# Patient Record
Sex: Female | Born: 1950 | Race: White | Hispanic: No | Marital: Married | Smoking: Never smoker
Health system: Southern US, Community
[De-identification: ages and names within clinical notes are randomized; demographics above are authoritative.]

## PROBLEM LIST (undated history)

## (undated) DIAGNOSIS — M797 Fibromyalgia: Secondary | ICD-10-CM

## (undated) HISTORY — PX: RECTAL PROLAPSE REPAIR: SHX759

## (undated) HISTORY — PX: VAGINAL MUCOSE PROLAPSE RESECTION: SHX2641

## (undated) HISTORY — PX: REPLACEMENT TOTAL KNEE: SUR1224

---

## 1992-08-30 HISTORY — PX: PARTIAL HYSTERECTOMY: SHX80

## 2016-12-03 DIAGNOSIS — M797 Fibromyalgia: Secondary | ICD-10-CM | POA: Diagnosis not present

## 2016-12-28 DIAGNOSIS — E039 Hypothyroidism, unspecified: Secondary | ICD-10-CM | POA: Diagnosis not present

## 2016-12-28 DIAGNOSIS — G44209 Tension-type headache, unspecified, not intractable: Secondary | ICD-10-CM | POA: Diagnosis not present

## 2016-12-28 DIAGNOSIS — J45909 Unspecified asthma, uncomplicated: Secondary | ICD-10-CM | POA: Diagnosis not present

## 2016-12-28 DIAGNOSIS — M797 Fibromyalgia: Secondary | ICD-10-CM | POA: Diagnosis not present

## 2016-12-28 DIAGNOSIS — F1290 Cannabis use, unspecified, uncomplicated: Secondary | ICD-10-CM | POA: Diagnosis not present

## 2016-12-28 DIAGNOSIS — F329 Major depressive disorder, single episode, unspecified: Secondary | ICD-10-CM | POA: Diagnosis not present

## 2016-12-28 DIAGNOSIS — F4310 Post-traumatic stress disorder, unspecified: Secondary | ICD-10-CM | POA: Diagnosis not present

## 2016-12-28 DIAGNOSIS — M1990 Unspecified osteoarthritis, unspecified site: Secondary | ICD-10-CM | POA: Diagnosis not present

## 2016-12-28 DIAGNOSIS — G253 Myoclonus: Secondary | ICD-10-CM | POA: Diagnosis not present

## 2017-01-07 DIAGNOSIS — M797 Fibromyalgia: Secondary | ICD-10-CM | POA: Diagnosis not present

## 2017-01-07 DIAGNOSIS — M542 Cervicalgia: Secondary | ICD-10-CM | POA: Diagnosis not present

## 2017-01-07 DIAGNOSIS — M545 Low back pain: Secondary | ICD-10-CM | POA: Diagnosis not present

## 2017-01-07 DIAGNOSIS — M546 Pain in thoracic spine: Secondary | ICD-10-CM | POA: Diagnosis not present

## 2017-01-13 DIAGNOSIS — M797 Fibromyalgia: Secondary | ICD-10-CM | POA: Diagnosis not present

## 2017-01-13 DIAGNOSIS — M545 Low back pain: Secondary | ICD-10-CM | POA: Diagnosis not present

## 2017-01-13 DIAGNOSIS — M546 Pain in thoracic spine: Secondary | ICD-10-CM | POA: Diagnosis not present

## 2017-01-13 DIAGNOSIS — M542 Cervicalgia: Secondary | ICD-10-CM | POA: Diagnosis not present

## 2017-01-14 DIAGNOSIS — M546 Pain in thoracic spine: Secondary | ICD-10-CM | POA: Diagnosis not present

## 2017-01-14 DIAGNOSIS — M542 Cervicalgia: Secondary | ICD-10-CM | POA: Diagnosis not present

## 2017-01-14 DIAGNOSIS — M797 Fibromyalgia: Secondary | ICD-10-CM | POA: Diagnosis not present

## 2017-01-14 DIAGNOSIS — M545 Low back pain: Secondary | ICD-10-CM | POA: Diagnosis not present

## 2017-01-17 DIAGNOSIS — M546 Pain in thoracic spine: Secondary | ICD-10-CM | POA: Diagnosis not present

## 2017-01-17 DIAGNOSIS — M545 Low back pain: Secondary | ICD-10-CM | POA: Diagnosis not present

## 2017-01-17 DIAGNOSIS — M797 Fibromyalgia: Secondary | ICD-10-CM | POA: Diagnosis not present

## 2017-01-17 DIAGNOSIS — M542 Cervicalgia: Secondary | ICD-10-CM | POA: Diagnosis not present

## 2017-01-19 DIAGNOSIS — M545 Low back pain: Secondary | ICD-10-CM | POA: Diagnosis not present

## 2017-01-19 DIAGNOSIS — M542 Cervicalgia: Secondary | ICD-10-CM | POA: Diagnosis not present

## 2017-01-19 DIAGNOSIS — M797 Fibromyalgia: Secondary | ICD-10-CM | POA: Diagnosis not present

## 2017-01-19 DIAGNOSIS — M546 Pain in thoracic spine: Secondary | ICD-10-CM | POA: Diagnosis not present

## 2017-01-20 ENCOUNTER — Ambulatory Visit (INDEPENDENT_AMBULATORY_CARE_PROVIDER_SITE_OTHER): Payer: Medicare HMO | Admitting: Orthopedic Surgery

## 2017-01-20 ENCOUNTER — Ambulatory Visit (INDEPENDENT_AMBULATORY_CARE_PROVIDER_SITE_OTHER): Payer: Medicare HMO

## 2017-01-20 DIAGNOSIS — E039 Hypothyroidism, unspecified: Secondary | ICD-10-CM | POA: Diagnosis not present

## 2017-01-20 DIAGNOSIS — G8929 Other chronic pain: Secondary | ICD-10-CM

## 2017-01-20 DIAGNOSIS — M25511 Pain in right shoulder: Secondary | ICD-10-CM | POA: Diagnosis not present

## 2017-01-20 DIAGNOSIS — M1990 Unspecified osteoarthritis, unspecified site: Secondary | ICD-10-CM | POA: Diagnosis not present

## 2017-01-20 DIAGNOSIS — F4310 Post-traumatic stress disorder, unspecified: Secondary | ICD-10-CM | POA: Diagnosis not present

## 2017-01-20 DIAGNOSIS — M797 Fibromyalgia: Secondary | ICD-10-CM | POA: Diagnosis not present

## 2017-01-20 DIAGNOSIS — G44209 Tension-type headache, unspecified, not intractable: Secondary | ICD-10-CM | POA: Diagnosis not present

## 2017-01-20 DIAGNOSIS — F329 Major depressive disorder, single episode, unspecified: Secondary | ICD-10-CM | POA: Diagnosis not present

## 2017-01-20 DIAGNOSIS — G253 Myoclonus: Secondary | ICD-10-CM | POA: Diagnosis not present

## 2017-01-20 DIAGNOSIS — F1290 Cannabis use, unspecified, uncomplicated: Secondary | ICD-10-CM | POA: Diagnosis not present

## 2017-01-20 DIAGNOSIS — J45909 Unspecified asthma, uncomplicated: Secondary | ICD-10-CM | POA: Diagnosis not present

## 2017-01-21 DIAGNOSIS — M797 Fibromyalgia: Secondary | ICD-10-CM | POA: Diagnosis not present

## 2017-01-21 DIAGNOSIS — M542 Cervicalgia: Secondary | ICD-10-CM | POA: Diagnosis not present

## 2017-01-21 DIAGNOSIS — M545 Low back pain: Secondary | ICD-10-CM | POA: Diagnosis not present

## 2017-01-21 DIAGNOSIS — M546 Pain in thoracic spine: Secondary | ICD-10-CM | POA: Diagnosis not present

## 2017-01-22 ENCOUNTER — Encounter (INDEPENDENT_AMBULATORY_CARE_PROVIDER_SITE_OTHER): Payer: Self-pay | Admitting: Orthopedic Surgery

## 2017-01-22 DIAGNOSIS — M25511 Pain in right shoulder: Secondary | ICD-10-CM | POA: Diagnosis not present

## 2017-01-22 DIAGNOSIS — G8929 Other chronic pain: Secondary | ICD-10-CM

## 2017-01-22 MED ORDER — BUPIVACAINE HCL 0.5 % IJ SOLN
9.0000 mL | INTRAMUSCULAR | Status: AC | PRN
  Administered 2017-01-22: 9 mL via INTRA_ARTICULAR

## 2017-01-22 MED ORDER — METHYLPREDNISOLONE ACETATE 40 MG/ML IJ SUSP
40.0000 mg | INTRAMUSCULAR | Status: AC | PRN
  Administered 2017-01-22: 40 mg via INTRA_ARTICULAR

## 2017-01-22 MED ORDER — LIDOCAINE HCL 1 % IJ SOLN
5.0000 mL | INTRAMUSCULAR | Status: AC | PRN
  Administered 2017-01-22: 5 mL
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
## 2017-01-25 DIAGNOSIS — M797 Fibromyalgia: Secondary | ICD-10-CM | POA: Diagnosis not present

## 2017-01-25 DIAGNOSIS — F4310 Post-traumatic stress disorder, unspecified: Secondary | ICD-10-CM | POA: Diagnosis not present

## 2017-01-25 DIAGNOSIS — J45909 Unspecified asthma, uncomplicated: Secondary | ICD-10-CM | POA: Diagnosis not present

## 2017-01-25 DIAGNOSIS — E039 Hypothyroidism, unspecified: Secondary | ICD-10-CM | POA: Diagnosis not present

## 2017-01-25 DIAGNOSIS — E7800 Pure hypercholesterolemia, unspecified: Secondary | ICD-10-CM | POA: Diagnosis not present

## 2017-01-25 DIAGNOSIS — F329 Major depressive disorder, single episode, unspecified: Secondary | ICD-10-CM | POA: Diagnosis not present

## 2017-01-26 DIAGNOSIS — M546 Pain in thoracic spine: Secondary | ICD-10-CM | POA: Diagnosis not present

## 2017-01-26 DIAGNOSIS — M542 Cervicalgia: Secondary | ICD-10-CM | POA: Diagnosis not present

## 2017-01-26 DIAGNOSIS — M797 Fibromyalgia: Secondary | ICD-10-CM | POA: Diagnosis not present

## 2017-01-26 DIAGNOSIS — M545 Low back pain: Secondary | ICD-10-CM | POA: Diagnosis not present

## 2017-01-28 DIAGNOSIS — M797 Fibromyalgia: Secondary | ICD-10-CM | POA: Diagnosis not present

## 2017-01-28 DIAGNOSIS — M542 Cervicalgia: Secondary | ICD-10-CM | POA: Diagnosis not present

## 2017-01-28 DIAGNOSIS — M545 Low back pain: Secondary | ICD-10-CM | POA: Diagnosis not present

## 2017-01-28 DIAGNOSIS — M546 Pain in thoracic spine: Secondary | ICD-10-CM | POA: Diagnosis not present

## 2017-01-31 DIAGNOSIS — M797 Fibromyalgia: Secondary | ICD-10-CM | POA: Diagnosis not present

## 2017-01-31 DIAGNOSIS — M545 Low back pain: Secondary | ICD-10-CM | POA: Diagnosis not present

## 2017-01-31 DIAGNOSIS — M546 Pain in thoracic spine: Secondary | ICD-10-CM | POA: Diagnosis not present

## 2017-01-31 DIAGNOSIS — M542 Cervicalgia: Secondary | ICD-10-CM | POA: Diagnosis not present

## 2017-02-01 DIAGNOSIS — F4310 Post-traumatic stress disorder, unspecified: Secondary | ICD-10-CM | POA: Diagnosis not present

## 2017-02-08 DIAGNOSIS — M797 Fibromyalgia: Secondary | ICD-10-CM | POA: Diagnosis not present

## 2017-02-08 DIAGNOSIS — M546 Pain in thoracic spine: Secondary | ICD-10-CM | POA: Diagnosis not present

## 2017-02-08 DIAGNOSIS — M542 Cervicalgia: Secondary | ICD-10-CM | POA: Diagnosis not present

## 2017-02-08 DIAGNOSIS — M545 Low back pain: Secondary | ICD-10-CM | POA: Diagnosis not present

## 2017-02-10 DIAGNOSIS — M546 Pain in thoracic spine: Secondary | ICD-10-CM | POA: Diagnosis not present

## 2017-02-10 DIAGNOSIS — M545 Low back pain: Secondary | ICD-10-CM | POA: Diagnosis not present

## 2017-02-10 DIAGNOSIS — M542 Cervicalgia: Secondary | ICD-10-CM | POA: Diagnosis not present

## 2017-02-10 DIAGNOSIS — M797 Fibromyalgia: Secondary | ICD-10-CM | POA: Diagnosis not present

## 2017-02-15 DIAGNOSIS — M797 Fibromyalgia: Secondary | ICD-10-CM | POA: Diagnosis not present

## 2017-02-15 DIAGNOSIS — M545 Low back pain: Secondary | ICD-10-CM | POA: Diagnosis not present

## 2017-02-15 DIAGNOSIS — M542 Cervicalgia: Secondary | ICD-10-CM | POA: Diagnosis not present

## 2017-02-15 DIAGNOSIS — M546 Pain in thoracic spine: Secondary | ICD-10-CM | POA: Diagnosis not present

## 2017-02-17 DIAGNOSIS — M542 Cervicalgia: Secondary | ICD-10-CM | POA: Diagnosis not present

## 2017-02-17 DIAGNOSIS — M545 Low back pain: Secondary | ICD-10-CM | POA: Diagnosis not present

## 2017-02-17 DIAGNOSIS — M546 Pain in thoracic spine: Secondary | ICD-10-CM | POA: Diagnosis not present

## 2017-02-17 DIAGNOSIS — M797 Fibromyalgia: Secondary | ICD-10-CM | POA: Diagnosis not present

## 2017-02-22 DIAGNOSIS — M546 Pain in thoracic spine: Secondary | ICD-10-CM | POA: Diagnosis not present

## 2017-02-22 DIAGNOSIS — M542 Cervicalgia: Secondary | ICD-10-CM | POA: Diagnosis not present

## 2017-02-22 DIAGNOSIS — M797 Fibromyalgia: Secondary | ICD-10-CM | POA: Diagnosis not present

## 2017-02-22 DIAGNOSIS — M545 Low back pain: Secondary | ICD-10-CM | POA: Diagnosis not present

## 2017-02-24 ENCOUNTER — Ambulatory Visit
Admission: RE | Admit: 2017-02-24 | Discharge: 2017-02-24 | Disposition: A | Discharge status: Home / Self Care | Payer: Medicare HMO | Source: Ambulatory Visit | Attending: Orthopedic Surgery | Admitting: Orthopedic Surgery

## 2017-02-24 DIAGNOSIS — G8929 Other chronic pain: Secondary | ICD-10-CM

## 2017-02-24 DIAGNOSIS — M25511 Pain in right shoulder: Principal | ICD-10-CM

## 2017-02-24 DIAGNOSIS — M67813 Other specified disorders of tendon, right shoulder: Secondary | ICD-10-CM | POA: Diagnosis not present

## 2017-02-24 MED ORDER — IOPAMIDOL (ISOVUE-M 200) INJECTION 41%
12.0000 mL | Freq: Once | INTRAMUSCULAR | Status: AC
  Administered 2017-02-24: 12 mL via INTRA_ARTICULAR

## 2017-02-25 DIAGNOSIS — M545 Low back pain: Secondary | ICD-10-CM | POA: Diagnosis not present

## 2017-02-25 DIAGNOSIS — M542 Cervicalgia: Secondary | ICD-10-CM | POA: Diagnosis not present

## 2017-02-25 DIAGNOSIS — M546 Pain in thoracic spine: Secondary | ICD-10-CM | POA: Diagnosis not present

## 2017-02-25 DIAGNOSIS — M797 Fibromyalgia: Secondary | ICD-10-CM | POA: Diagnosis not present

## 2017-03-01 DIAGNOSIS — M546 Pain in thoracic spine: Secondary | ICD-10-CM | POA: Diagnosis not present

## 2017-03-01 DIAGNOSIS — M797 Fibromyalgia: Secondary | ICD-10-CM | POA: Diagnosis not present

## 2017-03-01 DIAGNOSIS — M545 Low back pain: Secondary | ICD-10-CM | POA: Diagnosis not present

## 2017-03-01 DIAGNOSIS — M542 Cervicalgia: Secondary | ICD-10-CM | POA: Diagnosis not present

## 2017-03-03 ENCOUNTER — Ambulatory Visit (INDEPENDENT_AMBULATORY_CARE_PROVIDER_SITE_OTHER): Payer: Medicare HMO | Admitting: Orthopedic Surgery

## 2017-03-03 ENCOUNTER — Encounter (INDEPENDENT_AMBULATORY_CARE_PROVIDER_SITE_OTHER): Payer: Self-pay | Admitting: Orthopedic Surgery

## 2017-03-03 DIAGNOSIS — M25511 Pain in right shoulder: Secondary | ICD-10-CM

## 2017-03-03 DIAGNOSIS — G8929 Other chronic pain: Secondary | ICD-10-CM | POA: Diagnosis not present

## 2017-03-03 DIAGNOSIS — M797 Fibromyalgia: Secondary | ICD-10-CM | POA: Diagnosis not present

## 2017-03-03 DIAGNOSIS — M542 Cervicalgia: Secondary | ICD-10-CM | POA: Diagnosis not present

## 2017-03-03 DIAGNOSIS — M545 Low back pain: Secondary | ICD-10-CM | POA: Diagnosis not present

## 2017-03-03 DIAGNOSIS — M546 Pain in thoracic spine: Secondary | ICD-10-CM | POA: Diagnosis not present

## 2017-03-03 NOTE — Progress Notes (Signed)
   Office Visit Note   Patient: Kayla MoronBarbara Harrington           Date of Birth: 1951-04-10           MRN: [id number] Visit Date: 03/03/2017 Requested by: Juluis RainierBarnes, Elizabeth, MD [address] [place], [place] [address] PCP: Juluis RainierBarnes, Elizabeth, MD  Subjective: Chief Complaint  Patient presents with  . Right Shoulder - Follow-up, Pain    HPI: Kayla MccreedyBarbara is a 66 year old patient with right shoulder pain.  She had a right shoulder injection 01/20/2017.  She's here for MRI scan review.  States the shoulder did well for 6 weeks after the injection but now the pain has started to recur.  She is in physical therapy as well for strengthening.  She takes ibuprofen for headaches.              ROS: All systems reviewed are negative as they relate to the chief complaint within the history of present illness.  Patient denies  fevers or chills.   Assessment & Plan: Visit Diagnoses:  1. Chronic right shoulder pain     Plan: Impression is right shoulder tendinitis with no definite rotator cuff tear or labral problem.  Plan is to continue with physical therapy.  Could consider another injection later that she or.  Does not have an operative indication the right shoulder.  No discrete acromioclavicular joint tenderness today.  I'll see her back as needed  Follow-Up Instructions: Return if symptoms worsen or fail to improve.   Orders:  No orders of the defined types were placed in this encounter.  No orders of the defined types were placed in this encounter.     Procedures: No procedures performed   Clinical Data: No additional findings.  Objective: Vital Signs: There were no vitals taken for this visit.  Physical Exam:   Constitutional: Patient appears well-developed HEENT:  Head: Normocephalic Eyes:EOM are normal Neck: Normal range of motion Cardiovascular: Normal rate Pulmonary/chest: Effort normal Neurologic: Patient is alert Skin: Skin is warm Psychiatric: Patient has normal mood and  affect    Ortho Exam: Orthopedic exam demonstrates good cervical spine range of motion full active and passive range of motion of the right shoulder with no acromioclavicular joint tenderness on direct palpation right or left side.  Rotator cuff strength is intact.  Passive range of motion is maintained.  No other masses lymph adenopathy or skin changes noted in the shoulder girdle region  Specialty Comments:  No specialty comments available.  Imaging: No results found.   PMFS History: There are no active problems to display for this patient.  No past medical history on file.  No family history on file.  No past surgical history on file. Social History   Occupational History  . Not on file.   Social History Main Topics  . Smoking status: Unknown If Ever Smoked  . Smokeless tobacco: Never Used  . Alcohol use Not on file  . Drug use: Unknown  . Sexual activity: Not on file

## 2017-03-09 DIAGNOSIS — M797 Fibromyalgia: Secondary | ICD-10-CM | POA: Diagnosis not present

## 2017-03-09 DIAGNOSIS — M545 Low back pain: Secondary | ICD-10-CM | POA: Diagnosis not present

## 2017-03-09 DIAGNOSIS — M546 Pain in thoracic spine: Secondary | ICD-10-CM | POA: Diagnosis not present

## 2017-03-09 DIAGNOSIS — M542 Cervicalgia: Secondary | ICD-10-CM | POA: Diagnosis not present

## 2017-03-11 DIAGNOSIS — M542 Cervicalgia: Secondary | ICD-10-CM | POA: Diagnosis not present

## 2017-03-11 DIAGNOSIS — M797 Fibromyalgia: Secondary | ICD-10-CM | POA: Diagnosis not present

## 2017-03-11 DIAGNOSIS — M546 Pain in thoracic spine: Secondary | ICD-10-CM | POA: Diagnosis not present

## 2017-03-11 DIAGNOSIS — M545 Low back pain: Secondary | ICD-10-CM | POA: Diagnosis not present

## 2017-03-15 ENCOUNTER — Encounter: Payer: Self-pay | Admitting: Neurology

## 2017-03-15 ENCOUNTER — Ambulatory Visit (INDEPENDENT_AMBULATORY_CARE_PROVIDER_SITE_OTHER): Payer: Medicare HMO | Admitting: Neurology

## 2017-03-15 ENCOUNTER — Other Ambulatory Visit: Payer: Self-pay | Admitting: Neurology

## 2017-03-15 VITALS — BP 104/70 | HR 88 | Resp 16 | Ht 70.0 in | Wt 197.0 lb

## 2017-03-15 DIAGNOSIS — F4310 Post-traumatic stress disorder, unspecified: Secondary | ICD-10-CM | POA: Diagnosis not present

## 2017-03-15 DIAGNOSIS — M797 Fibromyalgia: Secondary | ICD-10-CM | POA: Diagnosis not present

## 2017-03-15 DIAGNOSIS — G250 Essential tremor: Secondary | ICD-10-CM

## 2017-03-15 DIAGNOSIS — G253 Myoclonus: Secondary | ICD-10-CM

## 2017-03-15 DIAGNOSIS — M542 Cervicalgia: Secondary | ICD-10-CM | POA: Insufficient documentation

## 2017-03-15 DIAGNOSIS — G4700 Insomnia, unspecified: Secondary | ICD-10-CM | POA: Diagnosis not present

## 2017-03-15 DIAGNOSIS — F418 Other specified anxiety disorders: Secondary | ICD-10-CM

## 2017-03-15 MED ORDER — GABAPENTIN 800 MG PO TABS: 800.0000 mg | ORAL_TABLET | Freq: Three times a day (TID) | ORAL | 3 refills | Status: AC

## 2017-03-15 MED ORDER — TIZANIDINE HCL 4 MG PO TABS: ORAL_TABLET | ORAL | 3 refills | Status: DC

## 2017-03-15 MED ORDER — TIZANIDINE HCL 4 MG PO TABS: ORAL_TABLET | ORAL | 0 refills | Status: DC

## 2017-03-15 MED ORDER — PRIMIDONE 50 MG PO TABS: 100.0000 mg | ORAL_TABLET | Freq: Two times a day (BID) | ORAL | 1 refills | Status: DC
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
## 2017-03-17 DIAGNOSIS — M797 Fibromyalgia: Secondary | ICD-10-CM | POA: Diagnosis not present

## 2017-03-17 DIAGNOSIS — M545 Low back pain: Secondary | ICD-10-CM | POA: Diagnosis not present

## 2017-03-17 DIAGNOSIS — M546 Pain in thoracic spine: Secondary | ICD-10-CM | POA: Diagnosis not present

## 2017-03-17 DIAGNOSIS — M542 Cervicalgia: Secondary | ICD-10-CM | POA: Diagnosis not present

## 2017-03-22 DIAGNOSIS — M797 Fibromyalgia: Secondary | ICD-10-CM | POA: Diagnosis not present

## 2017-03-22 DIAGNOSIS — M545 Low back pain: Secondary | ICD-10-CM | POA: Diagnosis not present

## 2017-03-22 DIAGNOSIS — M546 Pain in thoracic spine: Secondary | ICD-10-CM | POA: Diagnosis not present

## 2017-03-22 DIAGNOSIS — M542 Cervicalgia: Secondary | ICD-10-CM | POA: Diagnosis not present

## 2017-03-29 DIAGNOSIS — M546 Pain in thoracic spine: Secondary | ICD-10-CM | POA: Diagnosis not present

## 2017-03-29 DIAGNOSIS — M797 Fibromyalgia: Secondary | ICD-10-CM | POA: Diagnosis not present

## 2017-03-29 DIAGNOSIS — M545 Low back pain: Secondary | ICD-10-CM | POA: Diagnosis not present

## 2017-03-29 DIAGNOSIS — M542 Cervicalgia: Secondary | ICD-10-CM | POA: Diagnosis not present

## 2017-03-31 ENCOUNTER — Other Ambulatory Visit: Payer: Self-pay

## 2017-03-31 DIAGNOSIS — M545 Low back pain: Secondary | ICD-10-CM | POA: Diagnosis not present

## 2017-03-31 DIAGNOSIS — M542 Cervicalgia: Secondary | ICD-10-CM | POA: Diagnosis not present

## 2017-03-31 DIAGNOSIS — M797 Fibromyalgia: Secondary | ICD-10-CM | POA: Diagnosis not present

## 2017-03-31 DIAGNOSIS — M546 Pain in thoracic spine: Secondary | ICD-10-CM | POA: Diagnosis not present

## 2017-04-05 DIAGNOSIS — M546 Pain in thoracic spine: Secondary | ICD-10-CM | POA: Diagnosis not present

## 2017-04-05 DIAGNOSIS — M542 Cervicalgia: Secondary | ICD-10-CM | POA: Diagnosis not present

## 2017-04-05 DIAGNOSIS — M545 Low back pain: Secondary | ICD-10-CM | POA: Diagnosis not present

## 2017-04-05 DIAGNOSIS — M797 Fibromyalgia: Secondary | ICD-10-CM | POA: Diagnosis not present

## 2017-04-11 ENCOUNTER — Ambulatory Visit
Admission: RE | Admit: 2017-04-11 | Discharge: 2017-04-11 | Disposition: A | Discharge status: Home / Self Care | Payer: Medicare HMO | Source: Ambulatory Visit | Attending: Neurology | Admitting: Neurology

## 2017-04-11 ENCOUNTER — Other Ambulatory Visit: Payer: Self-pay | Admitting: Neurology

## 2017-04-11 DIAGNOSIS — G253 Myoclonus: Secondary | ICD-10-CM

## 2017-04-11 DIAGNOSIS — M50223 Other cervical disc displacement at C6-C7 level: Secondary | ICD-10-CM | POA: Diagnosis not present

## 2017-04-11 DIAGNOSIS — M50222 Other cervical disc displacement at C5-C6 level: Secondary | ICD-10-CM | POA: Diagnosis not present

## 2017-04-11 DIAGNOSIS — M542 Cervicalgia: Secondary | ICD-10-CM

## 2017-04-14 DIAGNOSIS — M546 Pain in thoracic spine: Secondary | ICD-10-CM | POA: Diagnosis not present

## 2017-04-14 DIAGNOSIS — M542 Cervicalgia: Secondary | ICD-10-CM | POA: Diagnosis not present

## 2017-04-14 DIAGNOSIS — M545 Low back pain: Secondary | ICD-10-CM | POA: Diagnosis not present

## 2017-04-14 DIAGNOSIS — M797 Fibromyalgia: Secondary | ICD-10-CM | POA: Diagnosis not present

## 2017-04-15 ENCOUNTER — Ambulatory Visit: Payer: Medicare HMO | Admitting: Psychology

## 2017-04-18 ENCOUNTER — Telehealth: Payer: Self-pay | Admitting: *Deleted

## 2017-04-18 NOTE — Telephone Encounter (Signed)
-----   Message from Asa Lente, MD sent at 04/17/2017  8:13 PM EDT ----- Please let her know that she has disc protrusions and bone spurs to the right at C3-C4 and to the left at C6-C7. These could cause some shoulder or arm pain but would not cause muscle jerking (myoclonus)

## 2017-04-18 NOTE — Telephone Encounter (Signed)
I have spoken with Kayla Harrington this afternoon, and per RAS, reviewed MRI results as below.  She verbalized understanding of same/fim

## 2017-04-21 DIAGNOSIS — M542 Cervicalgia: Secondary | ICD-10-CM | POA: Diagnosis not present

## 2017-04-21 DIAGNOSIS — M546 Pain in thoracic spine: Secondary | ICD-10-CM | POA: Diagnosis not present

## 2017-04-21 DIAGNOSIS — M797 Fibromyalgia: Secondary | ICD-10-CM | POA: Diagnosis not present

## 2017-04-21 DIAGNOSIS — M545 Low back pain: Secondary | ICD-10-CM | POA: Diagnosis not present

## 2017-04-22 DIAGNOSIS — F4310 Post-traumatic stress disorder, unspecified: Secondary | ICD-10-CM | POA: Diagnosis not present

## 2017-04-25 ENCOUNTER — Telehealth: Payer: Self-pay | Admitting: Neurology

## 2017-04-25 MED ORDER — ONDANSETRON 8 MG PO TBDP: 8.0000 mg | ORAL_TABLET | Freq: Three times a day (TID) | ORAL | 5 refills | Status: AC | PRN

## 2017-04-25 NOTE — Addendum Note (Signed)
Addended by: Candis Schatz I on: 04/25/2017 02:54 PM   Modules accepted: Orders

## 2017-04-25 NOTE — Telephone Encounter (Signed)
I have spoken with Chelsa this afternoon. She sts. her former neurologist rx's Zofran for nausea associated with h/a's.  She does not have to take this on a daily basis.  Allergic to Compazine and Phenergan.  Rx. escribed to [facility] as requested/fim

## 2017-04-25 NOTE — Telephone Encounter (Signed)
Patient requesting a new Rx for Ondansetron 8mg  which she takes every 4 hours as needed for nausea called to [facility] at [facility].

## 2017-04-26 DIAGNOSIS — F4310 Post-traumatic stress disorder, unspecified: Secondary | ICD-10-CM | POA: Diagnosis not present

## 2017-05-25 DIAGNOSIS — M545 Low back pain: Secondary | ICD-10-CM | POA: Diagnosis not present

## 2017-05-25 DIAGNOSIS — M546 Pain in thoracic spine: Secondary | ICD-10-CM | POA: Diagnosis not present

## 2017-05-25 DIAGNOSIS — M797 Fibromyalgia: Secondary | ICD-10-CM | POA: Diagnosis not present

## 2017-05-25 DIAGNOSIS — M542 Cervicalgia: Secondary | ICD-10-CM | POA: Diagnosis not present

## 2017-06-02 ENCOUNTER — Encounter: Payer: Self-pay | Admitting: Physical Medicine & Rehabilitation

## 2017-06-06 ENCOUNTER — Emergency Department (HOSPITAL_BASED_OUTPATIENT_CLINIC_OR_DEPARTMENT_OTHER)
Admission: EM | Admit: 2017-06-06 | Discharge: 2017-06-07 | Disposition: A | Discharge status: Home / Self Care | Payer: Medicare HMO | Attending: Emergency Medicine | Admitting: Emergency Medicine

## 2017-06-06 ENCOUNTER — Emergency Department (HOSPITAL_BASED_OUTPATIENT_CLINIC_OR_DEPARTMENT_OTHER): Payer: Medicare HMO

## 2017-06-06 ENCOUNTER — Encounter (HOSPITAL_BASED_OUTPATIENT_CLINIC_OR_DEPARTMENT_OTHER): Payer: Self-pay | Admitting: Emergency Medicine

## 2017-06-06 DIAGNOSIS — Z7982 Long term (current) use of aspirin: Secondary | ICD-10-CM | POA: Insufficient documentation

## 2017-06-06 DIAGNOSIS — K8500 Idiopathic acute pancreatitis without necrosis or infection: Secondary | ICD-10-CM | POA: Insufficient documentation

## 2017-06-06 DIAGNOSIS — R1013 Epigastric pain: Secondary | ICD-10-CM | POA: Diagnosis not present

## 2017-06-06 DIAGNOSIS — R1011 Right upper quadrant pain: Secondary | ICD-10-CM | POA: Diagnosis not present

## 2017-06-06 DIAGNOSIS — Z79899 Other long term (current) drug therapy: Secondary | ICD-10-CM | POA: Insufficient documentation

## 2017-06-06 DIAGNOSIS — R51 Headache: Secondary | ICD-10-CM | POA: Insufficient documentation

## 2017-06-06 DIAGNOSIS — E7800 Pure hypercholesterolemia, unspecified: Secondary | ICD-10-CM | POA: Diagnosis not present

## 2017-06-06 DIAGNOSIS — R109 Unspecified abdominal pain: Secondary | ICD-10-CM | POA: Diagnosis present

## 2017-06-06 DIAGNOSIS — K5900 Constipation, unspecified: Secondary | ICD-10-CM | POA: Diagnosis not present

## 2017-06-06 DIAGNOSIS — I959 Hypotension, unspecified: Secondary | ICD-10-CM | POA: Diagnosis not present

## 2017-06-06 DIAGNOSIS — E860 Dehydration: Secondary | ICD-10-CM | POA: Diagnosis not present

## 2017-06-06 DIAGNOSIS — R519 Headache, unspecified: Secondary | ICD-10-CM

## 2017-06-06 DIAGNOSIS — G8929 Other chronic pain: Secondary | ICD-10-CM | POA: Diagnosis not present

## 2017-06-06 HISTORY — DX: Fibromyalgia: M79.7

## 2017-06-06 LAB — COMPREHENSIVE METABOLIC PANEL
ALBUMIN: 4.2 g/dL (ref 3.5–5.0)
ALK PHOS: 57 U/L (ref 38–126)
ALT: 16 U/L (ref 14–54)
AST: 23 U/L (ref 15–41)
Anion gap: 6 (ref 5–15)
BILIRUBIN TOTAL: 0.4 mg/dL (ref 0.3–1.2)
BUN: 17 mg/dL (ref 6–20)
CALCIUM: 9.7 mg/dL (ref 8.9–10.3)
CO2: 28 mmol/L (ref 22–32)
CREATININE: 0.78 mg/dL (ref 0.44–1.00)
Chloride: 99 mmol/L — ABNORMAL LOW (ref 101–111)
GFR calc Af Amer: >60 mL/min (ref >60)
GFR calc non Af Amer: >60 mL/min (ref >60)
GLUCOSE: 90 mg/dL (ref 65–99)
Potassium: 4.2 mmol/L (ref 3.5–5.1)
SODIUM: 133 mmol/L — AB (ref 135–145)
TOTAL PROTEIN: 6.7 g/dL (ref 6.5–8.1)

## 2017-06-06 LAB — CBC WITH DIFFERENTIAL/PLATELET
Basophils Absolute: 0 10*3/uL (ref 0.0–0.1)
Basophils Relative: 0 %
EOS ABS: 0.1 10*3/uL (ref 0.0–0.7)
EOS PCT: 2 %
HCT: 41.2 % (ref 36.0–46.0)
HEMOGLOBIN: 13.7 g/dL (ref 12.0–15.0)
LYMPHS ABS: 1.4 10*3/uL (ref 0.7–4.0)
Lymphocytes Relative: 30 %
MCH: 30.2 pg (ref 26.0–34.0)
MCHC: 33.3 g/dL (ref 30.0–36.0)
MCV: 90.9 fL (ref 78.0–100.0)
MONO ABS: 0.4 10*3/uL (ref 0.1–1.0)
MONOS PCT: 8 %
Neutro Abs: 2.8 10*3/uL (ref 1.7–7.7)
Neutrophils Relative %: 60 %
Platelets: 183 10*3/uL (ref 150–400)
RBC: 4.53 MIL/uL (ref 3.87–5.11)
RDW: 13.7 % (ref 11.5–15.5)
WBC: 4.6 10*3/uL (ref 4.0–10.5)

## 2017-06-06 LAB — LIPASE, BLOOD: Lipase: 159 U/L — ABNORMAL HIGH (ref 11–51)

## 2017-06-06 LAB — I-STAT CG4 LACTIC ACID, ED: LACTIC ACID, VENOUS: 0.85 mmol/L (ref 0.5–1.9)

## 2017-06-06 LAB — TROPONIN I: Troponin I: <0.03 ng/mL (ref <0.03)

## 2017-06-06 MED ORDER — DEXAMETHASONE SODIUM PHOSPHATE 10 MG/ML IJ SOLN
10.0000 mg | Freq: Once | INTRAMUSCULAR | Status: AC
  Administered 2017-06-06: 10 mg via INTRAVENOUS
  Filled 2017-06-06: qty 1

## 2017-06-06 MED ORDER — VALPROATE SODIUM 500 MG/5ML IV SOLN
1000.0000 mg | Freq: Once | INTRAVENOUS | Status: AC
  Administered 2017-06-06: 1000 mg via INTRAVENOUS
  Filled 2017-06-06: qty 10

## 2017-06-06 MED ORDER — TIZANIDINE HCL 4 MG PO TABS
8.0000 mg | ORAL_TABLET | Freq: Once | ORAL | Status: DC
  Filled 2017-06-06: qty 2

## 2017-06-06 MED ORDER — IPRATROPIUM-ALBUTEROL 0.5-2.5 (3) MG/3ML IN SOLN
RESPIRATORY_TRACT | Status: AC
  Filled 2017-06-06: qty 3

## 2017-06-06 MED ORDER — VALPROATE SODIUM 500 MG/5ML IV SOLN
INTRAVENOUS | Status: AC
  Filled 2017-06-06: qty 10

## 2017-06-06 MED ORDER — GABAPENTIN 300 MG PO CAPS
800.0000 mg | ORAL_CAPSULE | Freq: Once | ORAL | Status: AC
  Administered 2017-06-06: 800 mg via ORAL
  Filled 2017-06-06: qty 2

## 2017-06-06 MED ORDER — CLONAZEPAM 1 MG PO TABS
1.0000 mg | ORAL_TABLET | Freq: Once | ORAL | Status: DC
  Filled 2017-06-06: qty 1

## 2017-06-06 MED ORDER — DIAZEPAM 5 MG PO TABS
5.0000 mg | ORAL_TABLET | Freq: Once | ORAL | Status: AC
  Administered 2017-06-06: 5 mg via ORAL
  Filled 2017-06-06: qty 1

## 2017-06-06 MED ORDER — MAGNESIUM CITRATE PO SOLN
1.0000 | Freq: Once | ORAL | Status: AC
  Administered 2017-06-06: 1 via ORAL
  Filled 2017-06-06: qty 296

## 2017-06-06 MED ORDER — SODIUM CHLORIDE 0.9 % IV BOLUS (SEPSIS)
1000.0000 mL | Freq: Once | INTRAVENOUS | Status: AC
  Administered 2017-06-06: 1000 mL via INTRAVENOUS

## 2017-06-06 MED ORDER — DIPHENHYDRAMINE HCL 50 MG/ML IJ SOLN
25.0000 mg | Freq: Once | INTRAMUSCULAR | Status: AC
  Administered 2017-06-06: 25 mg via INTRAVENOUS
  Filled 2017-06-06: qty 1

## 2017-06-06 MED ORDER — MAGNESIUM SULFATE 2 GM/50ML IV SOLN
2.0000 g | Freq: Once | INTRAVENOUS | Status: AC
  Administered 2017-06-06: 2 g via INTRAVENOUS
  Filled 2017-06-06: qty 50

## 2017-06-06 MED ORDER — KETOROLAC TROMETHAMINE 30 MG/ML IJ SOLN
30.0000 mg | Freq: Once | INTRAMUSCULAR | Status: AC
  Administered 2017-06-06: 30 mg via INTRAVENOUS
  Filled 2017-06-06: qty 1

## 2017-06-06 MED ORDER — METOCLOPRAMIDE HCL 5 MG/ML IJ SOLN
10.0000 mg | INTRAMUSCULAR | Status: AC
  Administered 2017-06-06: 10 mg via INTRAVENOUS
  Filled 2017-06-06: qty 2

## 2017-06-06 NOTE — ED Provider Notes (Signed)
[facility] Provider Note   CSN: [id number] Arrival date & time: 06/06/17  1616     History   Chief Complaint Chief Complaint  Patient presents with  . Dehydration  . gallbladder attack/sent  by PCP    HPI Kayla Harrington is a 66 y.o. female.  66yo F w/ PMH including fibromyalgia, anxiety/depression, PTSD, frequent headaches who p/w abdominal pain and headache. This morning after she woke up she had a sudden onset of central/epigastric pain that radiated to her back, was 7-8/10 in intensity, and lasted about 1.5 hours until it resolved spontaneously. No chest pain or SOB. She has a h/o chronic hx of R shoulder pain related to tendonitis and recent fall. She has had 4 days of headache similar to previous headaches. She gets them often. She went to PCP for the abdominal pain and he sent her here for further evaluation of abdominal pain. No abd pain currently. No nausea, vomiting, or diarrhea. She reports 3 weeks of constipation. She took 2 dulcolax today without relief yet.    The history is provided by the patient.    Past Medical History:  Diagnosis Date  . Fibromyalgia     Patient Active Problem List   Diagnosis Date Noted  . 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 Neck pain 03/15/2017  . Fibromyalgia 03/15/2017  . Benign essential tremor 03/15/2017  . PTSD (post-traumatic stress disorder) 03/15/2017  . Depression with anxiety 03/15/2017  . Insomnia 03/15/2017    Past Surgical History:  Procedure Laterality Date  . PARTIAL HYSTERECTOMY  1994  . RECTAL PROLAPSE REPAIR    . REPLACEMENT TOTAL KNEE Left   . VAGINAL MUCOSE PROLAPSE RESECTION      OB History    No data available       Home Medications    Prior to Admission medications   Medication Sig Start Date End Date Taking? Authorizing Provider  acyclovir (ZOVIRAX) 400 MG tablet Take 400 mg by mouth 5 (five) times daily.   Yes [provider]  Albuterol (PROVENTIL IN) Inhale into the lungs.   Yes  [provider]  aspirin EC 81 MG tablet Take 81 mg by mouth daily.   Yes [provider]  cholecalciferol (VITAMIN D) 1000 units tablet Take 5,000 Units by mouth daily.   Yes [provider]  clonazePAM (KLONOPIN) 2 MG tablet TK 1 T PO BID PRA 01/07/17  Yes [provider]  gabapentin (NEURONTIN) 800 MG tablet Take 1 tablet (800 mg total) by mouth 3 (three) times daily. 03/15/17  Yes Sater, Pearletha Furl, MD  ibuprofen (ADVIL,MOTRIN) 800 MG tablet  01/04/17  Yes [provider]  ondansetron (ZOFRAN ODT) 8 MG disintegrating tablet Take 1 tablet (8 mg total) by mouth every 8 (eight) hours as needed for nausea or vomiting. 04/25/17  Yes Sater, Pearletha Furl, MD  primidone (MYSOLINE) 50 MG tablet Take 2 tablets (100 mg total) by mouth 2 (two) times daily. 03/15/17  Yes Sater, Pearletha Furl, MD  QUEtiapine (SEROQUEL) 100 MG tablet  01/04/17  Yes [provider]  tiZANidine (ZANAFLEX) 4 MG tablet TAKE 1 TABLET BY MOUTH EVERY MORNING, TAKE 1 TABLET EVERY EVENING AND 2 TABLETS BY MOUTH EVERY NIGHT AT BEDTIME 04/11/17  Yes Sater, Pearletha Furl, MD  venlafaxine XR (EFFEXOR-XR) 75 MG 24 hr capsule TK 3 CS PO D 11/09/16  Yes [provider]  levothyroxine (SYNTHROID, LEVOTHROID) 125 MCG tablet  01/03/17   [provider]  venlafaxine (EFFEXOR) 75 MG tablet  01/04/17   [provider]  VENTOLIN HFA 108 (90 Base) MCG/ACT inhaler USE 2 PUFFS EVERY 4 H PRN 12/08/16   [provider]    Family History Family History  Problem Relation Age of Onset  . COPD Mother   . Heart failure Mother   . Diabetes type II Mother   . Stroke Mother   . Skin cancer Father   . Hypertension Father   . Dementia Father   . Brain cancer Brother   . Diabetes type II Brother   . Heart failure Brother   . COPD Brother     Social History Social History  Substance Use Topics  . Smoking status: Unknown If Ever Smoked  . Smokeless tobacco: Never Used  . Alcohol use  Not on file     Allergies   Compazine [prochlorperazine edisylate]; Cymbalta [duloxetine hcl]; and Phenergan [promethazine hcl]   Review of Systems Review of Systems All other systems reviewed and are negative except that which was mentioned in HPI   Physical Exam Updated Vital Signs BP 137/62 (BP Location: Right Arm)   Pulse 79   Temp 98.2 F (36.8 C) (Oral)   Resp 18   Ht  (1.778 m)   Wt 89.4 kg (197 lb)   SpO2 97%   BMI 28.27 kg/m   Physical Exam  Constitutional: She is oriented to person, place, and time. She appears well-developed and well-nourished. No distress.  uncomfortable  HENT:  Head: Normocephalic and atraumatic.  Mouth/Throat: Oropharynx is clear and moist.  Moist mucous membranes  Eyes: Pupils are equal, round, and reactive to light. Conjunctivae and EOM are normal.  Neck: Neck supple.  Cardiovascular: Normal rate, regular rhythm and normal heart sounds.   No murmur heard. Pulmonary/Chest: Effort normal and breath sounds normal.  Abdominal: Soft. Bowel sounds are normal. She exhibits no distension. There is no tenderness.  Musculoskeletal: She exhibits no edema.  Neurological: She is alert and oriented to person, place, and time.  Fluent speech Moving all 4 extremities equally No facial asymmetry  Skin: Skin is warm and dry.  Psychiatric:  Anxious, distressed, depressed mood  Nursing note and vitals reviewed.    ED Treatments / Results  Labs (all labs ordered are listed, but only abnormal results are displayed) Labs Reviewed  COMPREHENSIVE METABOLIC PANEL - Abnormal; Notable for the following:       Result Value   Sodium 133 (*)    Chloride 99 (*)    All other components within normal limits  LIPASE, BLOOD - Abnormal; Notable for the following:    Lipase 159 (*)    All other components within normal limits  CBC WITH DIFFERENTIAL/PLATELET  TROPONIN I  I-STAT CG4 LACTIC ACID, ED    EKG  EKG Interpretation  Date/Time:  Monday  June 06 2017 17:16:41 EDT Ventricular Rate:  69 PR Interval:    QRS Duration: 88 QT Interval:  379 QTC Calculation: 406 R Axis:   61 Text Interpretation:  Sinus rhythm Low voltage, precordial leads EKG WITHIN NORMAL LIMITS Confirmed by Frederick Peers [phone]) on 06/06/2017 5:21:41 PM       Radiology US Abdomen Limited Ruq  Result Date: 06/06/2017 CLINICAL DATA:  Epigastric pain radiating to the right shoulder for 4 days EXAM: ULTRASOUND ABDOMEN LIMITED RIGHT UPPER QUADRANT COMPARISON:  None. FINDINGS: Gallbladder: No gallstones or wall thickening visualized. No sonographic Murphy sign noted by sonographer. Common bile duct: Diameter: 5.2 mm Liver: No focal lesion identified. Within normal limits  in parenchymal echogenicity. Portal vein is patent on color Doppler imaging with normal direction of blood flow towards the liver. IMPRESSION: Normal liver, gallbladder and bile ducts. Electronically Signed   By: Ellery Plunk M.D.   On: 06/06/2017 19:01    Procedures Procedures (including critical care time)  Medications Ordered in ED Medications  clonazePAM (KLONOPIN) tablet 1 mg (1 mg Oral Not Given 06/06/17 2229)  tiZANidine (ZANAFLEX) tablet 8 mg (8 mg Oral Not Given 06/06/17 2247)  valproate (DEPACON) 500 MG/5ML injection (not administered)  sodium chloride 0.9 % bolus 1,000 mL (0 mLs Intravenous Stopped 06/06/17 1943)  metoCLOPramide (REGLAN) injection 10 mg (10 mg Intravenous Given 06/06/17 1747)  diphenhydrAMINE (BENADRYL) injection 25 mg (25 mg Intravenous Given 06/06/17 1747)  magnesium citrate solution 1 Bottle (1 Bottle Oral Given 06/06/17 1747)  ketorolac (TORADOL) 30 MG/ML injection 30 mg (30 mg Intravenous Given 06/06/17 2028)  magnesium sulfate IVPB 2 g 50 mL (0 g Intravenous Stopped 06/06/17 2124)  diazepam (VALIUM) tablet 5 mg (5 mg Oral Given 06/06/17 2028)  gabapentin (NEURONTIN) capsule 800 mg (800 mg Oral Given 06/06/17 2223)  dexamethasone (DECADRON) injection 10 mg (10 mg  Intravenous Given 06/06/17 2223)  valproate (DEPACON) 1,000 mg in dextrose 5 % 50 mL IVPB (0 mg Intravenous Stopped 06/07/17 0019)     Initial Impression / Assessment and Plan / ED Course  I have reviewed the triage vital signs and the nursing notes.  Pertinent labs & imaging results that were available during my care of the patient were reviewed by me and considered in my medical decision making (see chart for details).     Pt w/ sudden onset of epigastric pain that resolved spontaneously, Not associated with eating and no associated chest pain or shortness of breath. She was nontoxic on exam with normal vital signs. She mentioned her headache but states that the reason she came in was for her abdominal pain, her headaches are very common when she usually treats them at home. She had no focal abdominal tenderness on my exam and she denied any right upper quadrant tenderness. Regarding her shoulder pain, she has chronic right shoulder pain and this is unchanged recently. I did obtain EKG and trop which were reassuring.  Labs notable only for mildly elevated lipase at 159. Obtained US to evaluate for gallstones. US shows normal gallbladder and ducts. PT continues to have no abdominal pain and is tolerating liquids. Explained she may have mild episode of pancreatitis but given resolution of pain and no vomiting, recommended supportive measures including liquid diet, then gradual introduction of low fat solids as tolerated. Instructed to f/u with PCP for this.  Regarding her headaches, she had initially requested  IV dilaudid and I explained we do not use narcotics for headaches. Gave the patient extensive migraine cocktail including reglan, benadryl, IVF, magnesium, toradol, valium for neck muscle spasm, decadron,and finally depakote. On multiple reassessments she stated no improvement and on final reassessment she stated she was worse and just needed a shot of morphine or dilaudid. I again explained  that neurologists recommend not using narcotics for headache pain. She requested to go home stating that she would f/u with her neurologist for ongoing headache management. She did not want any other medications in ED. I sent note to her neurologist at [facility]. Return precautions reviewed and pt ambulatory at time of discharge.  Final Clinical Impressions(s) / ED Diagnoses   Final diagnoses:  Epigastric pain  Idiopathic acute pancreatitis without infection or necrosis  Chronic intractable headache, unspecified headache type    New Prescriptions Discharge Medication List as of 06/07/2017 12:12 AM       Dinorah Masullo, Ambrose Finland, MD 06/07/17 1238

## 2017-06-06 NOTE — ED Triage Notes (Signed)
Sent from PCP for possible dehydration  and gallbladder attack , epigastric pain, right shoulder pain. Hx shoulder tendinitis. deneis nausea nor vomiting. Alert and oriented x 4.  Added constipation x 2 weeks. Appears lethargic

## 2017-06-06 NOTE — ED Notes (Signed)
States she needs something for a headache and the only thing that works for her is Dilaudid  IV

## 2017-06-06 NOTE — ED Notes (Signed)
She still has a headache.

## 2017-06-06 NOTE — ED Notes (Signed)
Tolerated 1/2 bottle of water with no nausea.

## 2017-06-06 NOTE — ED Notes (Signed)
Pt is drinking water.

## 2017-06-06 NOTE — ED Notes (Signed)
States this medicine is just a waste of another hour. I just need the narcotic medicine. I am not an abuser. It's all that works for me.

## 2017-06-07 NOTE — ED Notes (Signed)
Pt verbalizes understanding of d/c instructions and denies any further needs at this time. 

## 2017-06-09 ENCOUNTER — Ambulatory Visit (INDEPENDENT_AMBULATORY_CARE_PROVIDER_SITE_OTHER): Payer: Medicare HMO | Admitting: Neurology

## 2017-06-09 DIAGNOSIS — G253 Myoclonus: Secondary | ICD-10-CM

## 2017-06-09 DIAGNOSIS — R51 Headache: Secondary | ICD-10-CM

## 2017-06-09 DIAGNOSIS — M542 Cervicalgia: Secondary | ICD-10-CM

## 2017-06-09 DIAGNOSIS — G8929 Other chronic pain: Secondary | ICD-10-CM

## 2017-06-09 DIAGNOSIS — M797 Fibromyalgia: Secondary | ICD-10-CM

## 2017-06-09 DIAGNOSIS — G250 Essential tremor: Secondary | ICD-10-CM

## 2017-06-09 DIAGNOSIS — R519 Headache, unspecified: Secondary | ICD-10-CM

## 2017-06-09 DIAGNOSIS — G4700 Insomnia, unspecified: Secondary | ICD-10-CM | POA: Diagnosis not present

## 2017-06-10 DIAGNOSIS — Z8719 Personal history of other diseases of the digestive system: Secondary | ICD-10-CM | POA: Diagnosis not present

## 2017-06-10 DIAGNOSIS — G44209 Tension-type headache, unspecified, not intractable: Secondary | ICD-10-CM | POA: Diagnosis not present

## 2017-06-13 ENCOUNTER — Telehealth: Payer: Self-pay | Admitting: Neurology

## 2017-06-13 ENCOUNTER — Encounter: Payer: Self-pay | Admitting: Neurology

## 2017-06-13 DIAGNOSIS — R519 Headache, unspecified: Secondary | ICD-10-CM | POA: Insufficient documentation

## 2017-06-13 DIAGNOSIS — R51 Headache: Secondary | ICD-10-CM

## 2017-06-13 MED ORDER — IMIPRAMINE HCL 25 MG PO TABS: 25.0000 mg | ORAL_TABLET | Freq: Every day | ORAL | 3 refills | Status: DC

## 2017-06-13 NOTE — Telephone Encounter (Signed)
Spoke with Kayla Harrington.  She sts. h/a returned Friday. Per RAS, ok for Imipramine  qhs.  She is agreeable.  Rx. escribed to [facility] per her request/fim

## 2017-06-13 NOTE — Telephone Encounter (Signed)
Pt called she is still having intractable HA. Today is a little better if she does not get out of bed at all. She is using tens unit. She said steroids have helped in the past. Please call to discuss
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
## 2017-06-16 DIAGNOSIS — M545 Low back pain: Secondary | ICD-10-CM | POA: Diagnosis not present

## 2017-06-16 DIAGNOSIS — M542 Cervicalgia: Secondary | ICD-10-CM | POA: Diagnosis not present

## 2017-06-16 DIAGNOSIS — M797 Fibromyalgia: Secondary | ICD-10-CM | POA: Diagnosis not present

## 2017-06-16 DIAGNOSIS — M546 Pain in thoracic spine: Secondary | ICD-10-CM | POA: Diagnosis not present

## 2017-06-17 ENCOUNTER — Telehealth: Payer: Self-pay

## 2017-06-17 ENCOUNTER — Encounter: Payer: Self-pay | Admitting: *Deleted

## 2017-06-17 ENCOUNTER — Encounter: Payer: Medicare HMO | Attending: Physical Medicine & Rehabilitation | Admitting: Physical Medicine & Rehabilitation

## 2017-06-17 VITALS — BP 164/86 | HR 100

## 2017-06-17 DIAGNOSIS — G43909 Migraine, unspecified, not intractable, without status migrainosus: Secondary | ICD-10-CM | POA: Insufficient documentation

## 2017-06-17 DIAGNOSIS — G4459 Other complicated headache syndrome: Secondary | ICD-10-CM

## 2017-06-17 DIAGNOSIS — G253 Myoclonus: Secondary | ICD-10-CM | POA: Insufficient documentation

## 2017-06-17 DIAGNOSIS — Z79899 Other long term (current) drug therapy: Secondary | ICD-10-CM | POA: Insufficient documentation

## 2017-06-17 DIAGNOSIS — M797 Fibromyalgia: Secondary | ICD-10-CM | POA: Insufficient documentation

## 2017-06-17 DIAGNOSIS — M25512 Pain in left shoulder: Secondary | ICD-10-CM | POA: Diagnosis not present

## 2017-06-17 DIAGNOSIS — F4310 Post-traumatic stress disorder, unspecified: Secondary | ICD-10-CM | POA: Insufficient documentation

## 2017-06-17 DIAGNOSIS — F329 Major depressive disorder, single episode, unspecified: Secondary | ICD-10-CM | POA: Diagnosis not present

## 2017-06-17 NOTE — Progress Notes (Signed)
Subjective:    Patient ID: Kayla MoronBarbara Harrington, female    DOB: 1951/03/19, 66 y.o.   MRN: [id number]  HPI 66 y/o with pmh/psh fibromyalgia, headaches, PTSD, ADD, anxiety, depression, myoclonus, neuropathy, left shoulder pain presents with migraines/headaches.  Started ~50 years, got worse ~30 years ago.  Pt went to the ED with abdominal pain, notes reviewed. Rest, ice improves the pain.  Stress, muscle spasms exacerbates the headaches.  Throbbing.  Non-radiating.  Constant.  Tried Motrin and tylenol, with some benefit.  Marijuana helps. Denies associated visual disturbance.  Multiple falls due to balance.  Pain limits enjoyable activities.  She follows up with Neurology for headaches and Ortho for left shoulder. Husband states almost no activity. Pt retired Engineer, civil (consulting)nurse.   Number of headache days/month:  Baseline 29 Number of headache hours per day: Baseline 24  Associated nausea Severe pain intensity  Photophobia; Mild Phonophobia: No Unilateral: Bilateral Pulsating: At times Disability/ER visits: 1 this year  Pain Inventory Average Pain 7 Pain Right Now 6 My pain is intermittent and aching  In the last 24 hours, has pain interfered with the following? General activity 6 Relation with others 7 Enjoyment of life 8 What TIME of day is your pain at its worst? all Sleep (in general) Good  Pain is worse with: some activites Pain improves with: heat/ice, therapy/exercise, TENS and injections Relief from Meds: 4  Mobility walk without assistance walk with assistance use a cane ability to climb steps?  yes do you drive?  no  Function disabled: date disabled 2007  Neuro/Psych numbness tremor tingling trouble walking spasms dizziness depression anxiety  Prior Studies Any changes since last visit?  no  Physicians involved in your care Any changes since last visit?  no   Family History  Problem Relation Age of Onset  . COPD Mother   . Heart failure Mother   . Diabetes type  II Mother   . Stroke Mother   . Skin cancer Father   . Hypertension Father   . Dementia Father   . Brain cancer Brother   . Diabetes type II Brother   . Heart failure Brother   . COPD Brother    Social History   Social History  . Marital status: Married    Spouse name: N/A  . Number of children: N/A  . Years of education: N/A   Social History Main Topics  . Smoking status: Never Smoker  . Smokeless tobacco: Never Used  . Alcohol use No  . Drug use: Yes    Types: Marijuana     Comment: used medical marijuana last 2 months ago  . Sexual activity: Not Asked   Other Topics Concern  . None   Social History Narrative  . None   Past Surgical History:  Procedure Laterality Date  . PARTIAL HYSTERECTOMY  1994  . RECTAL PROLAPSE REPAIR    . REPLACEMENT TOTAL KNEE Left   . VAGINAL MUCOSE PROLAPSE RESECTION     Past Medical History:  Diagnosis Date  . Fibromyalgia    BP (!) 164/86   Pulse 100   SpO2 98%   Opioid Risk Score:   Fall Risk Score:  `1  Depression screen PHQ 2/9  No flowsheet data found.   Review of Systems  Constitutional: Positive for appetite change.  HENT: Negative.   Eyes: Negative.   Respiratory: Negative.   Cardiovascular: Negative.   Gastrointestinal: Positive for constipation and nausea.  Endocrine: Negative.   Genitourinary: Negative.   Musculoskeletal: Positive  for arthralgias and myalgias.  Skin: Negative.   Allergic/Immunologic: Negative.   Neurological: Positive for headaches. Negative for numbness.  Hematological: Negative.   Psychiatric/Behavioral: Negative.   All other systems reviewed and are negative.      Objective:   Physical Exam Gen: NAD. Vital signs reviewed HENT: Normocephalic, Atraumatic Eyes: EOMI. No discharge.  Cardio: RRR. No JVD. Pulm: B/l clear to auscultation.  Effort normal Abd: Soft, BS+ MSK:  Gait antalgic.   No TTP   No edema.  Neuro: CN II-XII grossly intact.    Sensation intact to light touch  in all UE dermatomes  Strength  4+/5 in all UE myotomes  SLR neg Skin: Warm and Dry. Intact    Assessment & Plan:  66 y/o with pmh/psh fibromyalgia, headaches, PTSD, ADD, anxiety, depression, myoclonus, neuropathy, left shoulder pain presents with migraines/headaches.   1. Headaches  Being followed by Neurology  Recently started in imipramine with improvement, but increase in anxiety  2. Fibromyalgia  Ordered PT by PCP  Lyrica in effective  Allergic to Cymbalta  Trigger points with short term benefit  Could consider Sevella if pain returns  Being managed

## 2017-06-20 NOTE — Telephone Encounter (Signed)
Pt said while waiting for imipramine to kick in she is still experiencing severe HA's. Pt is very tearful, says she can't live like this. Said she is miserable. Please call

## 2017-06-20 NOTE — Telephone Encounter (Signed)
I have spoken with Kayla Harrington and encouraged decreased stimuli, ice/heat, massage, other relaxation techniques.  She doesn't feel the tpi given in our office helped for more than a few days.  Sts. is having nausea assoc. with h/a, but she hasn't picked Zofran up from the pharmacy yet. Tizanidine doesn't help as much as Soma, but RAS is not able to give Soma due to mult. other centrally activing meds that pt. is on. Sts. the referral from her pcp to a pain mx. physician "fell through."  She will check with her pcp for another referral to pain mx.  Sts. marijuana helps more than anything else, and she may forgo the copays/costs of other meds and just by marijuana as it helps.  She was very calm,  oriented times 4, no audible distress noted during our conversation./fim

## 2017-06-29 ENCOUNTER — Telehealth: Payer: Self-pay | Admitting: *Deleted

## 2017-06-29 DIAGNOSIS — M797 Fibromyalgia: Secondary | ICD-10-CM | POA: Diagnosis not present

## 2017-06-29 DIAGNOSIS — M542 Cervicalgia: Secondary | ICD-10-CM | POA: Diagnosis not present

## 2017-06-29 DIAGNOSIS — M545 Low back pain: Secondary | ICD-10-CM | POA: Diagnosis not present

## 2017-06-29 DIAGNOSIS — M546 Pain in thoracic spine: Secondary | ICD-10-CM | POA: Diagnosis not present

## 2017-06-29 MED ORDER — IMIPRAMINE HCL 25 MG PO TABS: 25.0000 mg | ORAL_TABLET | Freq: Every day | ORAL | 1 refills | Status: DC

## 2017-06-29 NOTE — Telephone Encounter (Signed)
Imipramine rx. converted from 30 day to 90 day rx. per faxed request from [employer]/fim

## 2017-07-01 ENCOUNTER — Ambulatory Visit (INDEPENDENT_AMBULATORY_CARE_PROVIDER_SITE_OTHER): Payer: Medicare HMO | Admitting: Psychology

## 2017-07-01 DIAGNOSIS — F4310 Post-traumatic stress disorder, unspecified: Secondary | ICD-10-CM | POA: Diagnosis not present

## 2017-07-01 DIAGNOSIS — F330 Major depressive disorder, recurrent, mild: Secondary | ICD-10-CM | POA: Diagnosis not present

## 2017-07-01 DIAGNOSIS — M546 Pain in thoracic spine: Secondary | ICD-10-CM | POA: Diagnosis not present

## 2017-07-01 DIAGNOSIS — M542 Cervicalgia: Secondary | ICD-10-CM | POA: Diagnosis not present

## 2017-07-01 DIAGNOSIS — M797 Fibromyalgia: Secondary | ICD-10-CM | POA: Diagnosis not present

## 2017-07-01 DIAGNOSIS — M545 Low back pain: Secondary | ICD-10-CM | POA: Diagnosis not present

## 2017-07-04 DIAGNOSIS — M542 Cervicalgia: Secondary | ICD-10-CM | POA: Diagnosis not present

## 2017-07-04 DIAGNOSIS — M545 Low back pain: Secondary | ICD-10-CM | POA: Diagnosis not present

## 2017-07-04 DIAGNOSIS — M797 Fibromyalgia: Secondary | ICD-10-CM | POA: Diagnosis not present

## 2017-07-04 DIAGNOSIS — M546 Pain in thoracic spine: Secondary | ICD-10-CM | POA: Diagnosis not present

## 2017-07-06 DIAGNOSIS — M797 Fibromyalgia: Secondary | ICD-10-CM | POA: Diagnosis not present

## 2017-07-06 DIAGNOSIS — M546 Pain in thoracic spine: Secondary | ICD-10-CM | POA: Diagnosis not present

## 2017-07-06 DIAGNOSIS — M545 Low back pain: Secondary | ICD-10-CM | POA: Diagnosis not present

## 2017-07-06 DIAGNOSIS — M542 Cervicalgia: Secondary | ICD-10-CM | POA: Diagnosis not present

## 2017-07-15 ENCOUNTER — Ambulatory Visit (INDEPENDENT_AMBULATORY_CARE_PROVIDER_SITE_OTHER): Payer: Medicare HMO | Admitting: Psychology

## 2017-07-15 DIAGNOSIS — M542 Cervicalgia: Secondary | ICD-10-CM | POA: Diagnosis not present

## 2017-07-15 DIAGNOSIS — F4310 Post-traumatic stress disorder, unspecified: Secondary | ICD-10-CM

## 2017-07-15 DIAGNOSIS — M546 Pain in thoracic spine: Secondary | ICD-10-CM | POA: Diagnosis not present

## 2017-07-15 DIAGNOSIS — F330 Major depressive disorder, recurrent, mild: Secondary | ICD-10-CM

## 2017-07-15 DIAGNOSIS — M797 Fibromyalgia: Secondary | ICD-10-CM | POA: Diagnosis not present

## 2017-07-15 DIAGNOSIS — M545 Low back pain: Secondary | ICD-10-CM | POA: Diagnosis not present

## 2017-07-19 DIAGNOSIS — E039 Hypothyroidism, unspecified: Secondary | ICD-10-CM | POA: Diagnosis not present

## 2017-07-19 DIAGNOSIS — I959 Hypotension, unspecified: Secondary | ICD-10-CM | POA: Diagnosis not present

## 2017-07-19 DIAGNOSIS — E7800 Pure hypercholesterolemia, unspecified: Secondary | ICD-10-CM | POA: Diagnosis not present

## 2017-07-19 DIAGNOSIS — E860 Dehydration: Secondary | ICD-10-CM | POA: Diagnosis not present

## 2017-07-19 DIAGNOSIS — F4310 Post-traumatic stress disorder, unspecified: Secondary | ICD-10-CM | POA: Diagnosis not present

## 2017-07-19 DIAGNOSIS — R1011 Right upper quadrant pain: Secondary | ICD-10-CM | POA: Diagnosis not present

## 2017-07-19 DIAGNOSIS — R51 Headache: Secondary | ICD-10-CM | POA: Diagnosis not present

## 2017-07-20 ENCOUNTER — Telehealth: Payer: Self-pay | Admitting: *Deleted

## 2017-07-20 ENCOUNTER — Ambulatory Visit: Payer: Medicare HMO | Admitting: Neurology

## 2017-07-20 NOTE — Telephone Encounter (Signed)
Received call from psych NP to make Dr. Epimenio FootSater aware she has seen pt., due to decreased sleep, mania, hallucinations, she d/cpt's Imipramine, increased Seroquel.  Will make Dr. Epimenio FootSater aware./fim

## 2017-07-25 ENCOUNTER — Encounter: Payer: Self-pay | Admitting: Neurology

## 2017-07-28 DIAGNOSIS — M545 Low back pain: Secondary | ICD-10-CM | POA: Diagnosis not present

## 2017-07-28 DIAGNOSIS — M546 Pain in thoracic spine: Secondary | ICD-10-CM | POA: Diagnosis not present

## 2017-07-28 DIAGNOSIS — M797 Fibromyalgia: Secondary | ICD-10-CM | POA: Diagnosis not present

## 2017-07-28 DIAGNOSIS — M542 Cervicalgia: Secondary | ICD-10-CM | POA: Diagnosis not present

## 2017-08-02 DIAGNOSIS — M797 Fibromyalgia: Secondary | ICD-10-CM | POA: Diagnosis not present

## 2017-08-02 DIAGNOSIS — M546 Pain in thoracic spine: Secondary | ICD-10-CM | POA: Diagnosis not present

## 2017-08-02 DIAGNOSIS — M545 Low back pain: Secondary | ICD-10-CM | POA: Diagnosis not present

## 2017-08-02 DIAGNOSIS — M542 Cervicalgia: Secondary | ICD-10-CM | POA: Diagnosis not present

## 2017-08-03 ENCOUNTER — Ambulatory Visit: Payer: Medicare HMO | Admitting: Psychology

## 2017-08-03 DIAGNOSIS — F4310 Post-traumatic stress disorder, unspecified: Secondary | ICD-10-CM | POA: Diagnosis not present

## 2017-08-03 DIAGNOSIS — F3181 Bipolar II disorder: Secondary | ICD-10-CM

## 2017-08-09 DIAGNOSIS — M797 Fibromyalgia: Secondary | ICD-10-CM | POA: Diagnosis not present

## 2017-08-09 DIAGNOSIS — M542 Cervicalgia: Secondary | ICD-10-CM | POA: Diagnosis not present

## 2017-08-09 DIAGNOSIS — M546 Pain in thoracic spine: Secondary | ICD-10-CM | POA: Diagnosis not present

## 2017-08-09 DIAGNOSIS — M545 Low back pain: Secondary | ICD-10-CM | POA: Diagnosis not present

## 2017-08-16 DIAGNOSIS — M797 Fibromyalgia: Secondary | ICD-10-CM | POA: Diagnosis not present

## 2017-08-16 DIAGNOSIS — M545 Low back pain: Secondary | ICD-10-CM | POA: Diagnosis not present

## 2017-08-16 DIAGNOSIS — M546 Pain in thoracic spine: Secondary | ICD-10-CM | POA: Diagnosis not present

## 2017-08-16 DIAGNOSIS — M542 Cervicalgia: Secondary | ICD-10-CM | POA: Diagnosis not present

## 2017-08-18 DIAGNOSIS — M542 Cervicalgia: Secondary | ICD-10-CM | POA: Diagnosis not present

## 2017-08-18 DIAGNOSIS — M797 Fibromyalgia: Secondary | ICD-10-CM | POA: Diagnosis not present

## 2017-08-18 DIAGNOSIS — M545 Low back pain: Secondary | ICD-10-CM | POA: Diagnosis not present

## 2017-08-18 DIAGNOSIS — M546 Pain in thoracic spine: Secondary | ICD-10-CM | POA: Diagnosis not present

## 2017-08-19 ENCOUNTER — Ambulatory Visit: Payer: Medicare HMO | Admitting: Psychology

## 2017-08-19 DIAGNOSIS — F3181 Bipolar II disorder: Secondary | ICD-10-CM | POA: Diagnosis not present

## 2017-08-19 DIAGNOSIS — F4310 Post-traumatic stress disorder, unspecified: Secondary | ICD-10-CM

## 2017-08-25 DIAGNOSIS — M797 Fibromyalgia: Secondary | ICD-10-CM | POA: Diagnosis not present

## 2017-08-25 DIAGNOSIS — M542 Cervicalgia: Secondary | ICD-10-CM | POA: Diagnosis not present

## 2017-08-25 DIAGNOSIS — M545 Low back pain: Secondary | ICD-10-CM | POA: Diagnosis not present

## 2017-08-25 DIAGNOSIS — M546 Pain in thoracic spine: Secondary | ICD-10-CM | POA: Diagnosis not present

## 2017-08-26 ENCOUNTER — Ambulatory Visit: Payer: Medicare HMO | Admitting: Psychology

## 2017-09-06 DIAGNOSIS — M546 Pain in thoracic spine: Secondary | ICD-10-CM | POA: Diagnosis not present

## 2017-09-06 DIAGNOSIS — M542 Cervicalgia: Secondary | ICD-10-CM | POA: Diagnosis not present

## 2017-09-06 DIAGNOSIS — M545 Low back pain: Secondary | ICD-10-CM | POA: Diagnosis not present

## 2017-09-06 DIAGNOSIS — M797 Fibromyalgia: Secondary | ICD-10-CM | POA: Diagnosis not present

## 2017-09-13 ENCOUNTER — Ambulatory Visit (INDEPENDENT_AMBULATORY_CARE_PROVIDER_SITE_OTHER): Payer: Medicare HMO | Admitting: Psychology

## 2017-09-13 DIAGNOSIS — F3181 Bipolar II disorder: Secondary | ICD-10-CM

## 2017-09-13 DIAGNOSIS — F4310 Post-traumatic stress disorder, unspecified: Secondary | ICD-10-CM | POA: Diagnosis not present

## 2017-09-15 DIAGNOSIS — F4310 Post-traumatic stress disorder, unspecified: Secondary | ICD-10-CM | POA: Diagnosis not present

## 2017-09-27 ENCOUNTER — Ambulatory Visit: Payer: Medicare HMO | Admitting: Psychology

## 2017-09-28 ENCOUNTER — Ambulatory Visit: Payer: Medicare HMO | Admitting: Psychology

## 2017-09-28 DIAGNOSIS — F4310 Post-traumatic stress disorder, unspecified: Secondary | ICD-10-CM | POA: Diagnosis not present

## 2017-09-28 DIAGNOSIS — F3181 Bipolar II disorder: Secondary | ICD-10-CM

## 2017-10-12 ENCOUNTER — Ambulatory Visit: Payer: Self-pay | Admitting: Psychology

## 2017-10-13 DIAGNOSIS — F4310 Post-traumatic stress disorder, unspecified: Secondary | ICD-10-CM | POA: Diagnosis not present

## 2017-10-18 ENCOUNTER — Telehealth: Payer: Self-pay | Admitting: Neurology

## 2017-10-18 ENCOUNTER — Encounter: Payer: Self-pay | Admitting: Neurology

## 2017-10-18 ENCOUNTER — Ambulatory Visit: Payer: Medicare HMO | Admitting: Neurology

## 2017-10-18 ENCOUNTER — Encounter (INDEPENDENT_AMBULATORY_CARE_PROVIDER_SITE_OTHER): Payer: Self-pay

## 2017-10-18 VITALS — BP 155/82 | HR 91 | Ht 70.0 in | Wt 219.5 lb

## 2017-10-18 DIAGNOSIS — G253 Myoclonus: Secondary | ICD-10-CM

## 2017-10-18 DIAGNOSIS — G250 Essential tremor: Secondary | ICD-10-CM

## 2017-10-18 DIAGNOSIS — M797 Fibromyalgia: Secondary | ICD-10-CM

## 2017-10-18 DIAGNOSIS — G4721 Circadian rhythm sleep disorder, delayed sleep phase type: Secondary | ICD-10-CM | POA: Diagnosis not present

## 2017-10-18 DIAGNOSIS — G4710 Hypersomnia, unspecified: Secondary | ICD-10-CM

## 2017-10-18 DIAGNOSIS — R5383 Other fatigue: Secondary | ICD-10-CM

## 2017-10-18 MED ORDER — ETODOLAC 400 MG PO TABS: 400.0000 mg | ORAL_TABLET | Freq: Two times a day (BID) | ORAL | 5 refills | Status: AC
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
## 2017-10-18 NOTE — Telephone Encounter (Signed)
Patient is very addiment about being seen sooner than 4 months due to her terrible headaches. She also requests for Dr. Bonnita HollowSater's RN to call her to discuss other treatment options.

## 2017-10-18 NOTE — Telephone Encounter (Signed)
Spoke with Britta MccreedyBarbara and explained that 1--RAS does not need to see her back sooner than every 4 mos.  Also explained, that, pt. should follow the tx. plan discussed during her ov at 1pm today.  She asks what would happen if she has a h/a that lasts for a month or more and she is not able to get out of bed.  I have explained that we would certainly work to get her in sooner if that happened/fim

## 2017-10-20 LAB — COMPREHENSIVE METABOLIC PANEL
ALBUMIN: 4.9 g/dL — AB (ref 3.6–4.8)
ALT: 18 IU/L (ref 0–32)
AST: 23 IU/L (ref 0–40)
Albumin/Globulin Ratio: 2.7 — ABNORMAL HIGH (ref 1.2–2.2)
Alkaline Phosphatase: 73 IU/L (ref 39–117)
BUN / CREAT RATIO: 16 (ref 12–28)
BUN: 13 mg/dL (ref 8–27)
Bilirubin Total: <0.2 mg/dL (ref 0.0–1.2)
CALCIUM: 10.5 mg/dL — AB (ref 8.7–10.3)
CO2: 25 mmol/L (ref 20–29)
CREATININE: 0.83 mg/dL (ref 0.57–1.00)
Chloride: 104 mmol/L (ref 96–106)
GFR calc Af Amer: 86 mL/min/{1.73_m2} (ref >59)
GFR, EST NON AFRICAN AMERICAN: 75 mL/min/{1.73_m2} (ref >59)
GLOBULIN, TOTAL: 1.8 g/dL (ref 1.5–4.5)
Glucose: 64 mg/dL — ABNORMAL LOW (ref 65–99)
Potassium: 5.2 mmol/L (ref 3.5–5.2)
SODIUM: 147 mmol/L — AB (ref 134–144)
Total Protein: 6.7 g/dL (ref 6.0–8.5)

## 2017-10-20 LAB — CBC WITH DIFFERENTIAL/PLATELET
BASOS: 1 %
Basophils Absolute: 0 10*3/uL (ref 0.0–0.2)
EOS (ABSOLUTE): 0.1 10*3/uL (ref 0.0–0.4)
EOS: 2 %
HEMATOCRIT: 41.4 % (ref 34.0–46.6)
Hemoglobin: 13.7 g/dL (ref 11.1–15.9)
IMMATURE GRANS (ABS): 0 10*3/uL (ref 0.0–0.1)
Immature Granulocytes: 0 %
LYMPHS: 40 %
Lymphocytes Absolute: 1.9 10*3/uL (ref 0.7–3.1)
MCH: 29.5 pg (ref 26.6–33.0)
MCHC: 33.1 g/dL (ref 31.5–35.7)
MCV: 89 fL (ref 79–97)
Monocytes Absolute: 0.5 10*3/uL (ref 0.1–0.9)
Monocytes: 10 %
NEUTROS PCT: 47 %
Neutrophils Absolute: 2.2 10*3/uL (ref 1.4–7.0)
Platelets: 230 10*3/uL (ref 150–379)
RBC: 4.65 x10E6/uL (ref 3.77–5.28)
RDW: 14.5 % (ref 12.3–15.4)
WBC: 4.7 10*3/uL (ref 3.4–10.8)

## 2017-10-20 LAB — EBV, CHRONIC/ACTIVE INFECTION
EBV Early Antigen Ab, IgG: <9 U/mL (ref 0.0–8.9)
EBV NA IgG: 470 U/mL — ABNORMAL HIGH (ref 0.0–17.9)
EBV VCA IgG: >600 U/mL — ABNORMAL HIGH (ref 0.0–17.9)

## 2017-10-20 LAB — TSH: TSH: 0.307 u[IU]/mL — ABNORMAL LOW (ref 0.450–4.500)

## 2017-10-26 ENCOUNTER — Ambulatory Visit: Payer: Medicare HMO | Admitting: Psychology

## 2017-10-26 ENCOUNTER — Telehealth: Payer: Self-pay | Admitting: *Deleted

## 2017-10-26 DIAGNOSIS — F3181 Bipolar II disorder: Secondary | ICD-10-CM | POA: Diagnosis not present

## 2017-10-26 DIAGNOSIS — F4310 Post-traumatic stress disorder, unspecified: Secondary | ICD-10-CM | POA: Diagnosis not present

## 2017-10-26 NOTE — Telephone Encounter (Signed)
Reached message that vm is full/fim

## 2017-10-26 NOTE — Telephone Encounter (Signed)
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  Her TSH is slightly low. Would she like us to forward the results to her primary care provider (if so, who handles thyroid) to see if they wants to adjust her Synthroid.

## 2017-10-28 ENCOUNTER — Telehealth: Payer: Self-pay | Admitting: *Deleted

## 2017-10-28 NOTE — Telephone Encounter (Signed)
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  Her TSH is slightly low. Would she like us to forward the results to her primary care provider (if so, who handles thyroid) to see if they wants to adjust her Synthroid.

## 2017-10-28 NOTE — Telephone Encounter (Signed)
Spoke with Britta MccreedyBarbara and reviewed below lab results.  She verbalized understanding of same.  Results forwarded to Dr. Juluis RainierElizabeth Barnes at [facility], fax# [phone] per her request/fim

## 2017-11-03 DIAGNOSIS — M9901 Segmental and somatic dysfunction of cervical region: Secondary | ICD-10-CM | POA: Diagnosis not present

## 2017-11-03 DIAGNOSIS — M5032 Other cervical disc degeneration, mid-cervical region, unspecified level: Secondary | ICD-10-CM | POA: Diagnosis not present

## 2017-11-03 DIAGNOSIS — M5137 Other intervertebral disc degeneration, lumbosacral region: Secondary | ICD-10-CM | POA: Diagnosis not present

## 2017-11-03 DIAGNOSIS — M9903 Segmental and somatic dysfunction of lumbar region: Secondary | ICD-10-CM | POA: Diagnosis not present

## 2017-11-04 DIAGNOSIS — E7800 Pure hypercholesterolemia, unspecified: Secondary | ICD-10-CM | POA: Diagnosis not present

## 2017-11-04 DIAGNOSIS — Z1389 Encounter for screening for other disorder: Secondary | ICD-10-CM | POA: Diagnosis not present

## 2017-11-04 DIAGNOSIS — Z0000 Encounter for general adult medical examination without abnormal findings: Secondary | ICD-10-CM | POA: Diagnosis not present

## 2017-11-04 DIAGNOSIS — J45909 Unspecified asthma, uncomplicated: Secondary | ICD-10-CM | POA: Diagnosis not present

## 2017-11-04 DIAGNOSIS — F329 Major depressive disorder, single episode, unspecified: Secondary | ICD-10-CM | POA: Diagnosis not present

## 2017-11-04 DIAGNOSIS — K5900 Constipation, unspecified: Secondary | ICD-10-CM | POA: Diagnosis not present

## 2017-11-04 DIAGNOSIS — M1990 Unspecified osteoarthritis, unspecified site: Secondary | ICD-10-CM | POA: Diagnosis not present

## 2017-11-04 DIAGNOSIS — E039 Hypothyroidism, unspecified: Secondary | ICD-10-CM | POA: Diagnosis not present

## 2017-11-04 DIAGNOSIS — F4310 Post-traumatic stress disorder, unspecified: Secondary | ICD-10-CM | POA: Diagnosis not present

## 2017-11-07 DIAGNOSIS — M5137 Other intervertebral disc degeneration, lumbosacral region: Secondary | ICD-10-CM | POA: Diagnosis not present

## 2017-11-07 DIAGNOSIS — M9901 Segmental and somatic dysfunction of cervical region: Secondary | ICD-10-CM | POA: Diagnosis not present

## 2017-11-07 DIAGNOSIS — M5032 Other cervical disc degeneration, mid-cervical region, unspecified level: Secondary | ICD-10-CM | POA: Diagnosis not present

## 2017-11-07 DIAGNOSIS — M9903 Segmental and somatic dysfunction of lumbar region: Secondary | ICD-10-CM | POA: Diagnosis not present

## 2017-11-09 ENCOUNTER — Ambulatory Visit (INDEPENDENT_AMBULATORY_CARE_PROVIDER_SITE_OTHER): Payer: Medicare HMO | Admitting: Psychology

## 2017-11-09 DIAGNOSIS — M9903 Segmental and somatic dysfunction of lumbar region: Secondary | ICD-10-CM | POA: Diagnosis not present

## 2017-11-09 DIAGNOSIS — M5137 Other intervertebral disc degeneration, lumbosacral region: Secondary | ICD-10-CM | POA: Diagnosis not present

## 2017-11-09 DIAGNOSIS — F3181 Bipolar II disorder: Secondary | ICD-10-CM

## 2017-11-09 DIAGNOSIS — F4310 Post-traumatic stress disorder, unspecified: Secondary | ICD-10-CM

## 2017-11-09 DIAGNOSIS — M9901 Segmental and somatic dysfunction of cervical region: Secondary | ICD-10-CM | POA: Diagnosis not present

## 2017-11-09 DIAGNOSIS — M5032 Other cervical disc degeneration, mid-cervical region, unspecified level: Secondary | ICD-10-CM | POA: Diagnosis not present

## 2017-11-14 DIAGNOSIS — F4310 Post-traumatic stress disorder, unspecified: Secondary | ICD-10-CM | POA: Diagnosis not present

## 2017-11-14 DIAGNOSIS — M5032 Other cervical disc degeneration, mid-cervical region, unspecified level: Secondary | ICD-10-CM | POA: Diagnosis not present

## 2017-11-14 DIAGNOSIS — M9903 Segmental and somatic dysfunction of lumbar region: Secondary | ICD-10-CM | POA: Diagnosis not present

## 2017-11-14 DIAGNOSIS — M9901 Segmental and somatic dysfunction of cervical region: Secondary | ICD-10-CM | POA: Diagnosis not present

## 2017-11-14 DIAGNOSIS — M5137 Other intervertebral disc degeneration, lumbosacral region: Secondary | ICD-10-CM | POA: Diagnosis not present

## 2017-11-17 ENCOUNTER — Telehealth: Payer: Self-pay | Admitting: Neurology

## 2017-11-17 DIAGNOSIS — M9901 Segmental and somatic dysfunction of cervical region: Secondary | ICD-10-CM | POA: Diagnosis not present

## 2017-11-17 DIAGNOSIS — M5137 Other intervertebral disc degeneration, lumbosacral region: Secondary | ICD-10-CM | POA: Diagnosis not present

## 2017-11-17 DIAGNOSIS — M5032 Other cervical disc degeneration, mid-cervical region, unspecified level: Secondary | ICD-10-CM | POA: Diagnosis not present

## 2017-11-17 DIAGNOSIS — M9903 Segmental and somatic dysfunction of lumbar region: Secondary | ICD-10-CM | POA: Diagnosis not present

## 2017-11-17 MED ORDER — TIZANIDINE HCL 4 MG PO TABS: ORAL_TABLET | ORAL | 1 refills | Status: AC

## 2017-11-17 MED ORDER — PRIMIDONE 50 MG PO TABS: 100.0000 mg | ORAL_TABLET | Freq: Two times a day (BID) | ORAL | 1 refills | Status: AC

## 2017-11-17 NOTE — Telephone Encounter (Signed)
Per RAS, ok for Tizanidine, up to 6 tablets daily, but dose can't be increased beyond that.  Spoke with pt. and made her aware. She verbalized understanding of same.  Tizanidine and Primidone escrib[facility] to [facility] per her request/fim

## 2017-11-17 NOTE — Telephone Encounter (Signed)
Pt called stating that she takes 5-6 tablets a day of tiZANidine (ZANAFLEX) 4 MG tablet(to maintain her headaches) and will need a new updated [facility] as well as a refill for primidone (MYSOLINE) 50 MG tablet

## 2017-11-22 DIAGNOSIS — M9903 Segmental and somatic dysfunction of lumbar region: Secondary | ICD-10-CM | POA: Diagnosis not present

## 2017-11-22 DIAGNOSIS — M5137 Other intervertebral disc degeneration, lumbosacral region: Secondary | ICD-10-CM | POA: Diagnosis not present

## 2017-11-22 DIAGNOSIS — M5032 Other cervical disc degeneration, mid-cervical region, unspecified level: Secondary | ICD-10-CM | POA: Diagnosis not present

## 2017-11-22 DIAGNOSIS — M9901 Segmental and somatic dysfunction of cervical region: Secondary | ICD-10-CM | POA: Diagnosis not present

## 2017-11-23 ENCOUNTER — Ambulatory Visit: Payer: Medicare HMO | Admitting: Psychology

## 2017-11-23 DIAGNOSIS — F3181 Bipolar II disorder: Secondary | ICD-10-CM

## 2017-11-23 DIAGNOSIS — F4310 Post-traumatic stress disorder, unspecified: Secondary | ICD-10-CM

## 2017-11-28 ENCOUNTER — Other Ambulatory Visit: Payer: Self-pay | Admitting: Family Medicine

## 2017-11-28 DIAGNOSIS — Z139 Encounter for screening, unspecified: Secondary | ICD-10-CM

## 2017-11-29 DIAGNOSIS — M5137 Other intervertebral disc degeneration, lumbosacral region: Secondary | ICD-10-CM | POA: Diagnosis not present

## 2017-11-29 DIAGNOSIS — M9903 Segmental and somatic dysfunction of lumbar region: Secondary | ICD-10-CM | POA: Diagnosis not present

## 2017-11-29 DIAGNOSIS — M5032 Other cervical disc degeneration, mid-cervical region, unspecified level: Secondary | ICD-10-CM | POA: Diagnosis not present

## 2017-11-29 DIAGNOSIS — M9901 Segmental and somatic dysfunction of cervical region: Secondary | ICD-10-CM | POA: Diagnosis not present

## 2017-12-07 ENCOUNTER — Ambulatory Visit: Payer: Medicare HMO | Admitting: Psychology

## 2017-12-13 DIAGNOSIS — J45901 Unspecified asthma with (acute) exacerbation: Secondary | ICD-10-CM | POA: Diagnosis not present

## 2017-12-15 DIAGNOSIS — J45901 Unspecified asthma with (acute) exacerbation: Secondary | ICD-10-CM | POA: Diagnosis not present

## 2017-12-17 DIAGNOSIS — J45901 Unspecified asthma with (acute) exacerbation: Secondary | ICD-10-CM | POA: Diagnosis not present

## 2017-12-17 DIAGNOSIS — R000 Tachycardia, unspecified: Secondary | ICD-10-CM | POA: Diagnosis not present

## 2017-12-17 DIAGNOSIS — R0602 Shortness of breath: Secondary | ICD-10-CM | POA: Diagnosis not present

## 2017-12-17 DIAGNOSIS — J069 Acute upper respiratory infection, unspecified: Secondary | ICD-10-CM | POA: Diagnosis not present

## 2017-12-17 DIAGNOSIS — R062 Wheezing: Secondary | ICD-10-CM | POA: Diagnosis not present

## 2017-12-17 DIAGNOSIS — R0600 Dyspnea, unspecified: Secondary | ICD-10-CM | POA: Diagnosis not present

## 2017-12-20 ENCOUNTER — Ambulatory Visit: Payer: Self-pay

## 2017-12-21 ENCOUNTER — Ambulatory Visit: Payer: Medicare HMO | Admitting: Psychology

## 2017-12-27 DIAGNOSIS — J45901 Unspecified asthma with (acute) exacerbation: Secondary | ICD-10-CM | POA: Diagnosis not present

## 2017-12-27 DIAGNOSIS — N390 Urinary tract infection, site not specified: Secondary | ICD-10-CM | POA: Diagnosis not present

## 2018-01-04 ENCOUNTER — Ambulatory Visit: Payer: Medicare HMO | Admitting: Psychology

## 2018-02-15 ENCOUNTER — Ambulatory Visit: Payer: Medicare HMO | Admitting: Neurology

## 2018-07-16 IMAGING — US US ABDOMEN LIMITED
1 series · 14 of 25 positions shown · non-contrast
Comparison: None.

CLINICAL DATA: Epigastric pain radiating to the right shoulder for
4 days

EXAM:
ULTRASOUND ABDOMEN LIMITED RIGHT UPPER QUADRANT

[Series 1: us abdomen limited · 0.18mm/px · 14 of 44 slices shown]
[im 1/44]
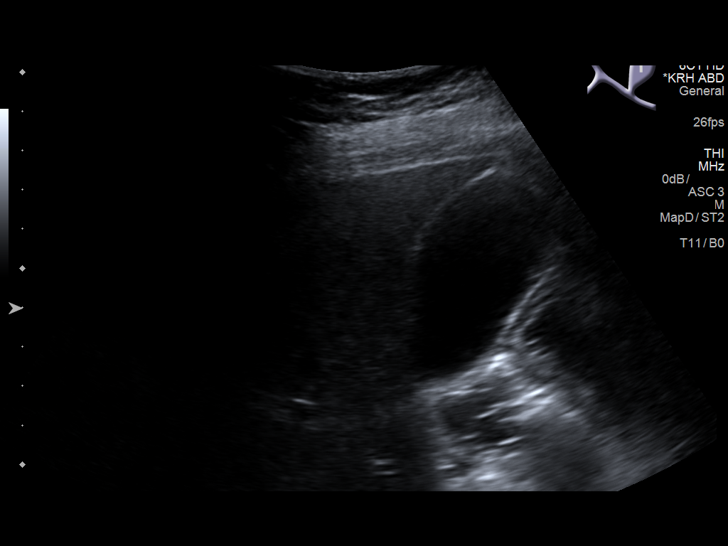
[im 4/44]
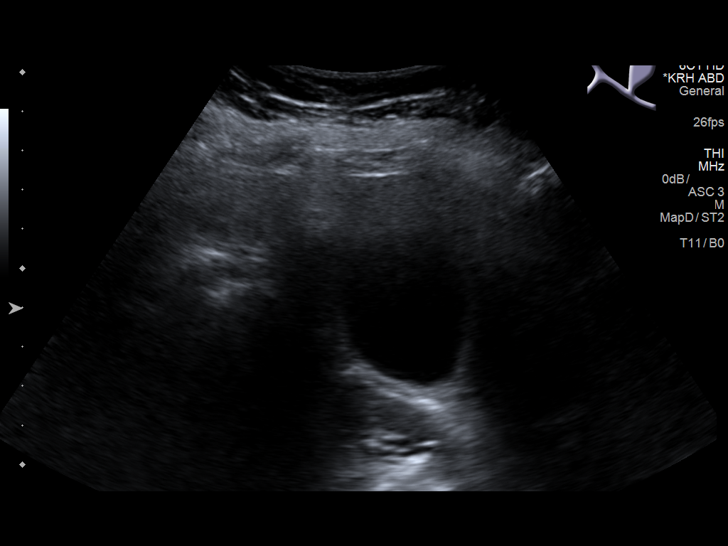
[im 8/44]
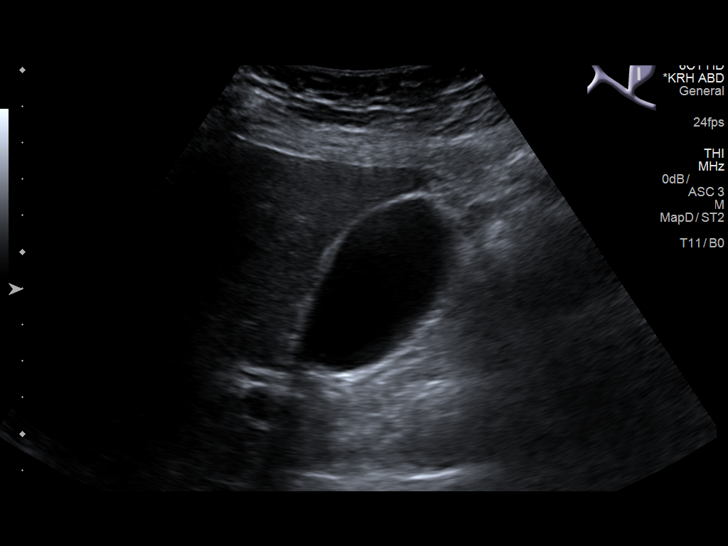
[im 11/44]
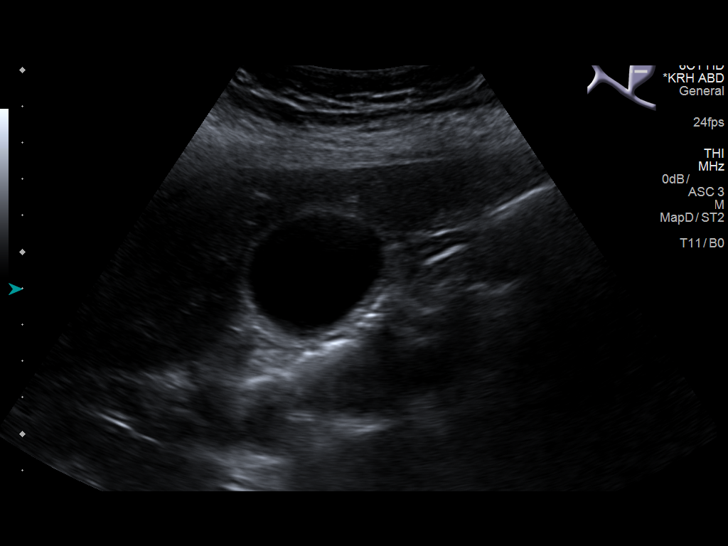
[im 15/44]
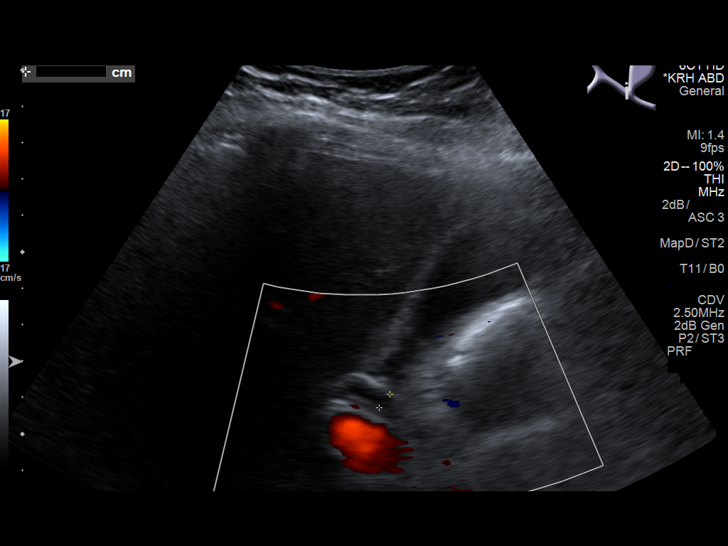
[im 17/44]
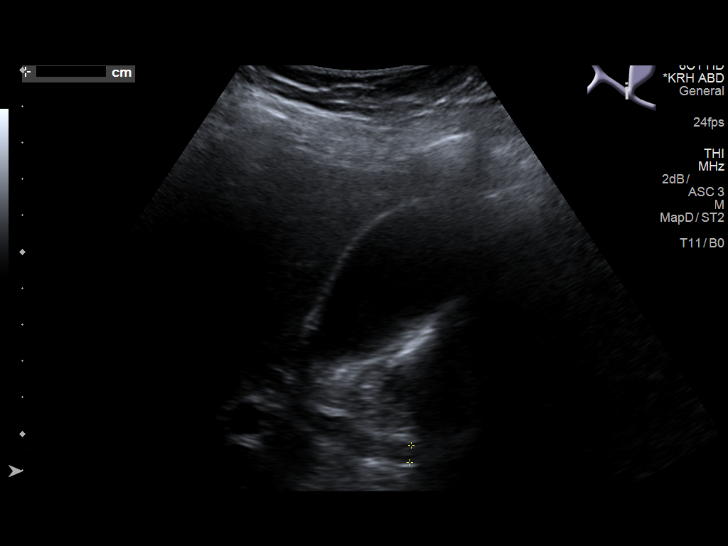
[im 20/44]
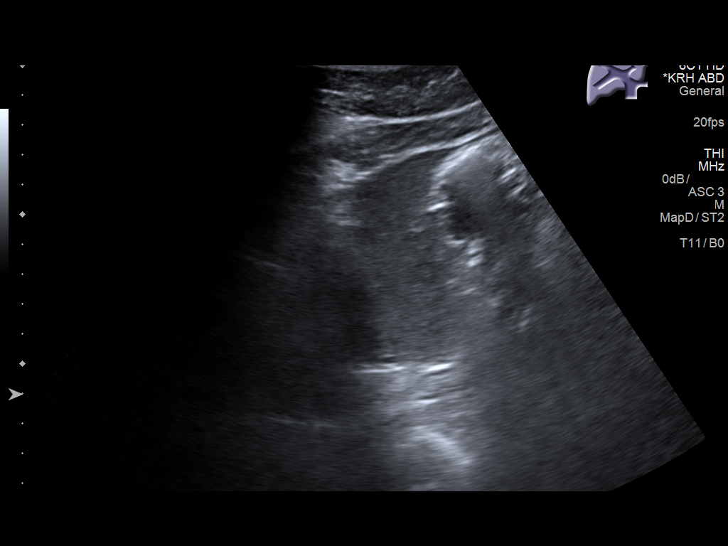
[im 24/44]
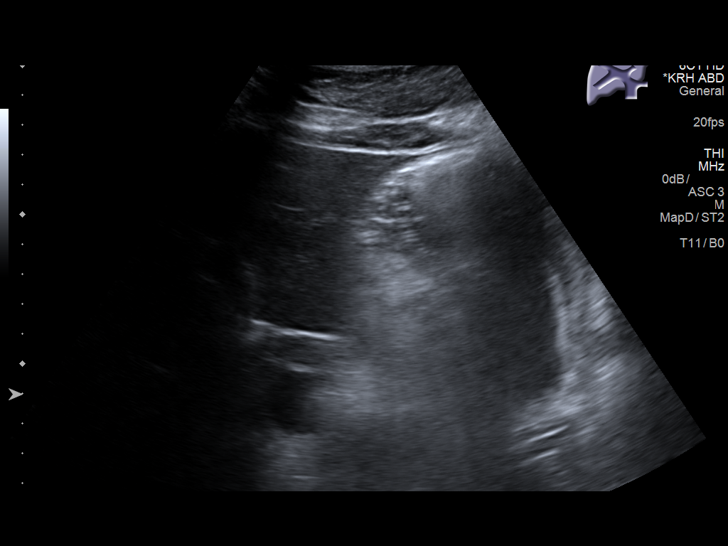
[im 27/44]
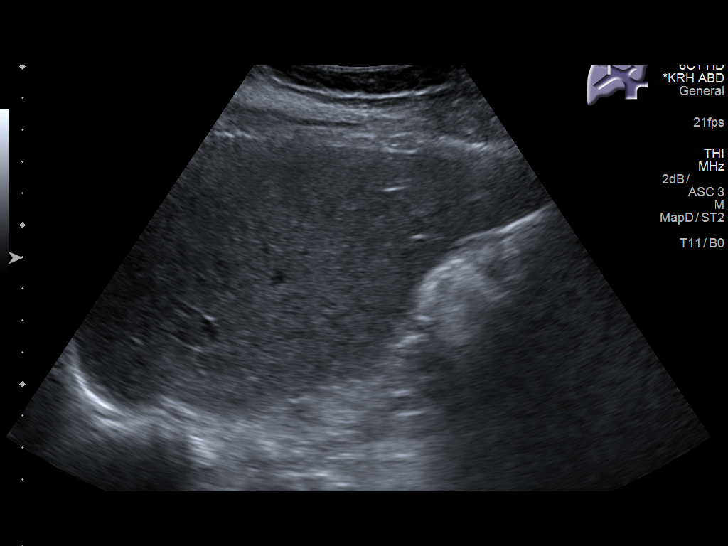
[im 29/44]
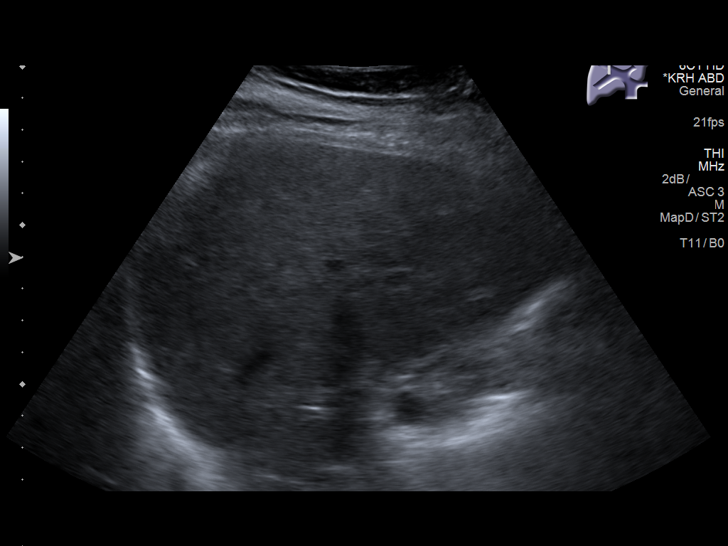
[im 33/44]
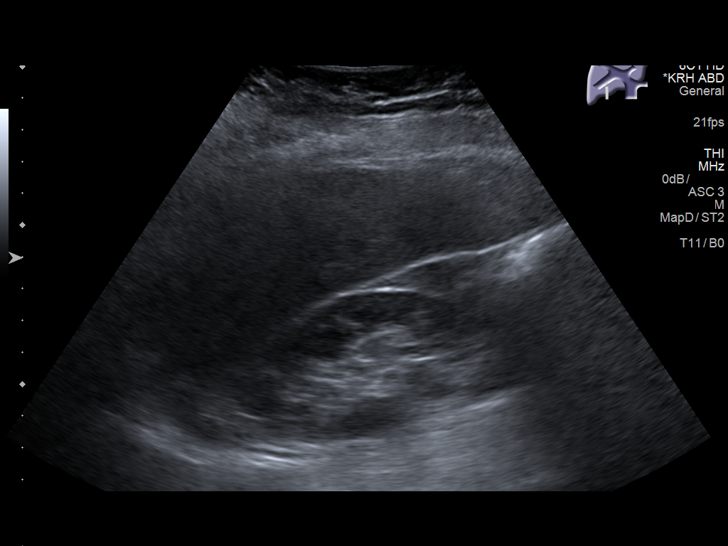
[im 36/44]
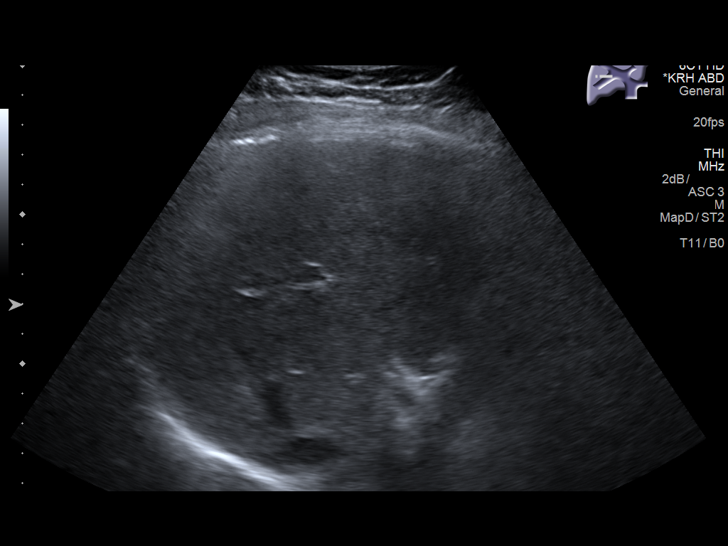
[im 40/44]
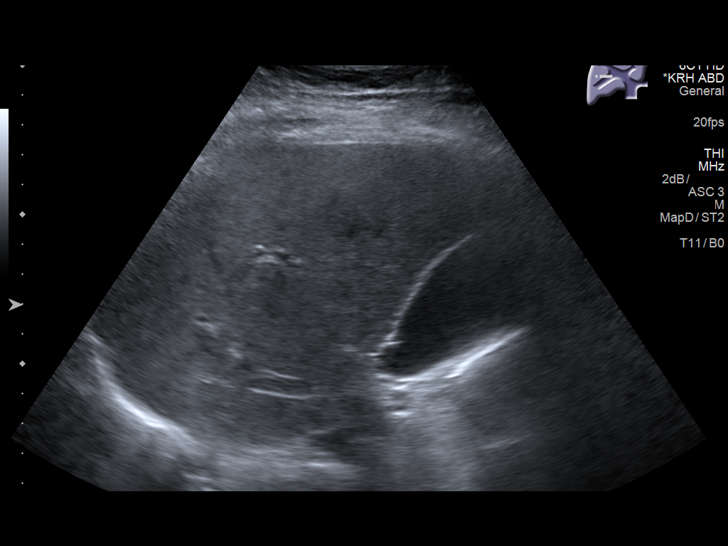
[im 44/44]
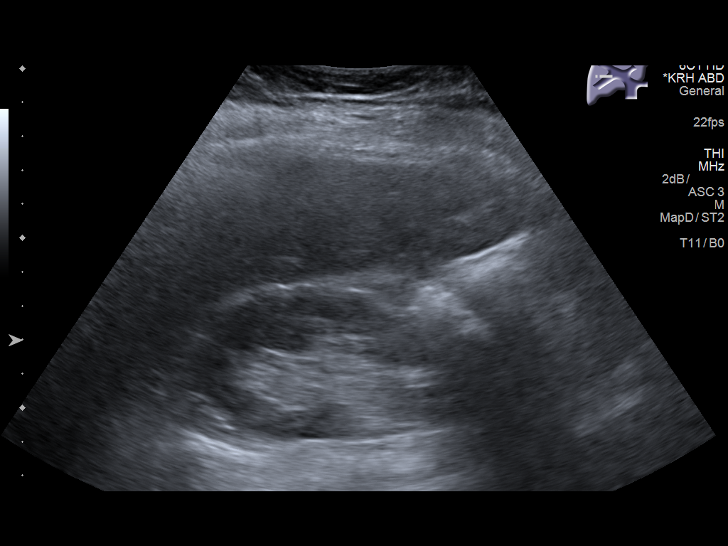

[14 of 25 positions shown; findings below may reference images not displayed]

FINDINGS: Gallbladder:

No gallstones or wall thickening visualized. No sonographic Murphy
sign noted by sonographer.

Common bile duct:

Diameter: 5.2 mm

Liver:

No focal lesion identified. Within normal limits in parenchymal
echogenicity. Portal vein is patent on color Doppler imaging with
normal direction of blood flow towards the liver.
IMPRESSION: Normal liver, gallbladder and bile ducts.
